# Patient Record
Sex: Female | Born: 1940 | Race: White | Hispanic: No | Smoking: Never smoker
Health system: Southern US, Community
[De-identification: ages and names within clinical notes are randomized; demographics above are authoritative.]

## PROBLEM LIST (undated history)

## (undated) DIAGNOSIS — M1990 Unspecified osteoarthritis, unspecified site: Secondary | ICD-10-CM

## (undated) DIAGNOSIS — C801 Malignant (primary) neoplasm, unspecified: Secondary | ICD-10-CM

## (undated) DIAGNOSIS — J189 Pneumonia, unspecified organism: Secondary | ICD-10-CM

## (undated) DIAGNOSIS — Z9289 Personal history of other medical treatment: Secondary | ICD-10-CM

## (undated) DIAGNOSIS — Z9221 Personal history of antineoplastic chemotherapy: Secondary | ICD-10-CM

## (undated) DIAGNOSIS — I10 Essential (primary) hypertension: Secondary | ICD-10-CM

## (undated) DIAGNOSIS — Z923 Personal history of irradiation: Secondary | ICD-10-CM

## (undated) DIAGNOSIS — E785 Hyperlipidemia, unspecified: Secondary | ICD-10-CM

## (undated) DIAGNOSIS — K5790 Diverticulosis of intestine, part unspecified, without perforation or abscess without bleeding: Secondary | ICD-10-CM

## (undated) DIAGNOSIS — K219 Gastro-esophageal reflux disease without esophagitis: Secondary | ICD-10-CM

## (undated) DIAGNOSIS — G4730 Sleep apnea, unspecified: Secondary | ICD-10-CM

## (undated) DIAGNOSIS — R350 Frequency of micturition: Secondary | ICD-10-CM

## (undated) DIAGNOSIS — Z973 Presence of spectacles and contact lenses: Secondary | ICD-10-CM

## (undated) DIAGNOSIS — E039 Hypothyroidism, unspecified: Secondary | ICD-10-CM

## (undated) DIAGNOSIS — Z8709 Personal history of other diseases of the respiratory system: Secondary | ICD-10-CM

## (undated) HISTORY — DX: Malignant (primary) neoplasm, unspecified: C80.1

## (undated) HISTORY — PX: CARPAL TUNNEL RELEASE: SHX101

## (undated) HISTORY — DX: Sleep apnea, unspecified: G47.30

## (undated) HISTORY — DX: Hyperlipidemia, unspecified: E78.5

## (undated) HISTORY — PX: ABDOMINAL HYSTERECTOMY: SHX81

## (undated) HISTORY — DX: Diverticulosis of intestine, part unspecified, without perforation or abscess without bleeding: K57.90

## (undated) HISTORY — PX: MASTECTOMY: SHX3

## (undated) HISTORY — PX: COLONOSCOPY: SHX174

## (undated) HISTORY — PX: TOTAL HIP ARTHROPLASTY: SHX124

## (undated) HISTORY — DX: Unspecified osteoarthritis, unspecified site: M19.90

## (undated) HISTORY — PX: ANTERIOR AND POSTERIOR VAGINAL REPAIR: SUR5

## (undated) HISTORY — DX: Hypothyroidism, unspecified: E03.9

---

## 1998-07-01 ENCOUNTER — Other Ambulatory Visit: Admission: RE | Admit: 1998-07-01 | Discharge: 1998-07-01 | Payer: Self-pay | Admitting: Obstetrics and Gynecology

## 1999-07-07 ENCOUNTER — Encounter (INDEPENDENT_AMBULATORY_CARE_PROVIDER_SITE_OTHER): Payer: Self-pay | Admitting: *Deleted

## 1999-07-22 ENCOUNTER — Other Ambulatory Visit: Admission: RE | Admit: 1999-07-22 | Discharge: 1999-07-22 | Payer: Self-pay | Admitting: Obstetrics and Gynecology

## 1999-12-05 ENCOUNTER — Encounter: Admission: RE | Admit: 1999-12-05 | Discharge: 1999-12-05 | Payer: Self-pay | Admitting: Family Medicine

## 2000-01-10 ENCOUNTER — Emergency Department (HOSPITAL_COMMUNITY): Admission: EM | Admit: 2000-01-10 | Discharge: 2000-01-10 | Payer: Self-pay | Admitting: Emergency Medicine

## 2000-05-26 ENCOUNTER — Encounter: Admission: RE | Admit: 2000-05-26 | Discharge: 2000-05-26 | Payer: Self-pay | Admitting: Family Medicine

## 2000-07-22 ENCOUNTER — Other Ambulatory Visit: Admission: RE | Admit: 2000-07-22 | Discharge: 2000-07-22 | Payer: Self-pay | Admitting: Obstetrics and Gynecology

## 2001-03-04 ENCOUNTER — Encounter: Admission: RE | Admit: 2001-03-04 | Discharge: 2001-03-04 | Payer: Self-pay | Admitting: Family Medicine

## 2001-03-10 ENCOUNTER — Encounter: Payer: Self-pay | Admitting: Family Medicine

## 2001-03-10 ENCOUNTER — Encounter: Admission: RE | Admit: 2001-03-10 | Discharge: 2001-03-10 | Payer: Self-pay | Admitting: Family Medicine

## 2001-03-15 ENCOUNTER — Encounter: Admission: RE | Admit: 2001-03-15 | Discharge: 2001-03-15 | Payer: Self-pay | Admitting: Sports Medicine

## 2001-03-15 ENCOUNTER — Encounter: Admission: RE | Admit: 2001-03-15 | Discharge: 2001-03-15 | Payer: Self-pay | Admitting: Family Medicine

## 2001-03-25 ENCOUNTER — Encounter: Admission: RE | Admit: 2001-03-25 | Discharge: 2001-03-25 | Payer: Self-pay | Admitting: Family Medicine

## 2001-03-29 ENCOUNTER — Encounter: Admission: RE | Admit: 2001-03-29 | Discharge: 2001-03-29 | Payer: Self-pay | Admitting: Sports Medicine

## 2001-05-06 ENCOUNTER — Encounter: Admission: RE | Admit: 2001-05-06 | Discharge: 2001-05-06 | Payer: Self-pay | Admitting: Family Medicine

## 2001-10-31 ENCOUNTER — Other Ambulatory Visit: Admission: RE | Admit: 2001-10-31 | Discharge: 2001-10-31 | Payer: Self-pay | Admitting: Obstetrics and Gynecology

## 2002-05-15 ENCOUNTER — Encounter: Admission: RE | Admit: 2002-05-15 | Discharge: 2002-05-15 | Payer: Self-pay | Admitting: Family Medicine

## 2002-10-06 ENCOUNTER — Encounter: Admission: RE | Admit: 2002-10-06 | Discharge: 2002-10-06 | Payer: Self-pay | Admitting: Family Medicine

## 2002-11-03 ENCOUNTER — Other Ambulatory Visit: Admission: RE | Admit: 2002-11-03 | Discharge: 2002-11-03 | Payer: Self-pay | Admitting: Obstetrics and Gynecology

## 2003-11-05 ENCOUNTER — Other Ambulatory Visit: Admission: RE | Admit: 2003-11-05 | Discharge: 2003-11-05 | Payer: Self-pay | Admitting: Obstetrics and Gynecology

## 2004-02-29 ENCOUNTER — Encounter: Admission: RE | Admit: 2004-02-29 | Discharge: 2004-02-29 | Payer: Self-pay | Admitting: Family Medicine

## 2004-03-17 ENCOUNTER — Ambulatory Visit: Payer: Self-pay | Admitting: Family Medicine

## 2004-03-24 ENCOUNTER — Encounter: Admission: RE | Admit: 2004-03-24 | Discharge: 2004-03-24 | Payer: Self-pay | Admitting: Sports Medicine

## 2004-04-04 ENCOUNTER — Ambulatory Visit: Payer: Self-pay | Admitting: Family Medicine

## 2004-04-23 ENCOUNTER — Encounter: Admission: RE | Admit: 2004-04-23 | Discharge: 2004-04-23 | Payer: Self-pay | Admitting: Otolaryngology

## 2004-05-14 ENCOUNTER — Ambulatory Visit: Payer: Self-pay | Admitting: Family Medicine

## 2004-07-04 ENCOUNTER — Ambulatory Visit: Payer: Self-pay | Admitting: Family Medicine

## 2004-07-14 ENCOUNTER — Ambulatory Visit: Payer: Self-pay | Admitting: Family Medicine

## 2004-09-12 ENCOUNTER — Ambulatory Visit (HOSPITAL_BASED_OUTPATIENT_CLINIC_OR_DEPARTMENT_OTHER): Admission: RE | Admit: 2004-09-12 | Discharge: 2004-09-12 | Payer: Self-pay | Admitting: Orthopedic Surgery

## 2004-09-12 ENCOUNTER — Ambulatory Visit (HOSPITAL_COMMUNITY): Admission: RE | Admit: 2004-09-12 | Discharge: 2004-09-12 | Payer: Self-pay | Admitting: Orthopedic Surgery

## 2004-10-03 ENCOUNTER — Ambulatory Visit: Payer: Self-pay | Admitting: Family Medicine

## 2004-11-07 ENCOUNTER — Ambulatory Visit: Payer: Self-pay | Admitting: Family Medicine

## 2004-12-10 ENCOUNTER — Ambulatory Visit: Payer: Self-pay | Admitting: Family Medicine

## 2004-12-10 ENCOUNTER — Encounter: Admission: RE | Admit: 2004-12-10 | Discharge: 2004-12-10 | Payer: Self-pay | Admitting: Family Medicine

## 2004-12-12 ENCOUNTER — Ambulatory Visit: Payer: Self-pay | Admitting: Family Medicine

## 2004-12-25 ENCOUNTER — Ambulatory Visit: Payer: Self-pay | Admitting: Family Medicine

## 2004-12-26 ENCOUNTER — Encounter: Admission: RE | Admit: 2004-12-26 | Discharge: 2004-12-26 | Payer: Self-pay | Admitting: Family Medicine

## 2005-03-11 ENCOUNTER — Encounter: Admission: RE | Admit: 2005-03-11 | Discharge: 2005-03-11 | Payer: Self-pay | Admitting: Family Medicine

## 2005-03-16 ENCOUNTER — Ambulatory Visit (HOSPITAL_COMMUNITY): Admission: RE | Admit: 2005-03-16 | Discharge: 2005-03-16 | Payer: Self-pay | Admitting: Gastroenterology

## 2005-04-20 ENCOUNTER — Ambulatory Visit: Payer: Self-pay | Admitting: Family Medicine

## 2005-10-12 ENCOUNTER — Ambulatory Visit: Payer: Self-pay | Admitting: Family Medicine

## 2005-12-14 ENCOUNTER — Other Ambulatory Visit: Admission: RE | Admit: 2005-12-14 | Discharge: 2005-12-14 | Payer: Self-pay | Admitting: Obstetrics & Gynecology

## 2006-02-12 ENCOUNTER — Ambulatory Visit: Payer: Self-pay | Admitting: Family Medicine

## 2006-03-12 ENCOUNTER — Encounter: Admission: RE | Admit: 2006-03-12 | Discharge: 2006-03-12 | Payer: Self-pay | Admitting: Sports Medicine

## 2006-03-12 ENCOUNTER — Ambulatory Visit: Payer: Self-pay | Admitting: Sports Medicine

## 2006-03-17 ENCOUNTER — Ambulatory Visit: Payer: Self-pay | Admitting: Sports Medicine

## 2006-03-31 ENCOUNTER — Ambulatory Visit: Payer: Self-pay | Admitting: Sports Medicine

## 2006-04-01 ENCOUNTER — Encounter: Admission: RE | Admit: 2006-04-01 | Discharge: 2006-04-01 | Payer: Self-pay | Admitting: Sports Medicine

## 2006-05-10 ENCOUNTER — Ambulatory Visit: Payer: Self-pay | Admitting: Sports Medicine

## 2006-06-04 ENCOUNTER — Emergency Department (HOSPITAL_COMMUNITY): Admission: EM | Admit: 2006-06-04 | Discharge: 2006-06-04 | Payer: Self-pay | Admitting: Emergency Medicine

## 2006-09-02 DIAGNOSIS — M1990 Unspecified osteoarthritis, unspecified site: Secondary | ICD-10-CM

## 2006-09-02 DIAGNOSIS — E7800 Pure hypercholesterolemia, unspecified: Secondary | ICD-10-CM

## 2006-09-02 DIAGNOSIS — E039 Hypothyroidism, unspecified: Secondary | ICD-10-CM

## 2006-09-02 DIAGNOSIS — G5600 Carpal tunnel syndrome, unspecified upper limb: Secondary | ICD-10-CM

## 2006-09-03 ENCOUNTER — Encounter (INDEPENDENT_AMBULATORY_CARE_PROVIDER_SITE_OTHER): Payer: Self-pay | Admitting: *Deleted

## 2006-10-29 ENCOUNTER — Ambulatory Visit: Payer: Self-pay | Admitting: Family Medicine

## 2006-10-29 ENCOUNTER — Encounter: Admission: RE | Admit: 2006-10-29 | Discharge: 2006-10-29 | Payer: Self-pay | Admitting: Family Medicine

## 2006-10-29 LAB — CONVERTED CEMR LAB
ALT: 32 units/L (ref 0–35)
CO2: 26 meq/L (ref 19–32)
Calcium: 9.3 mg/dL (ref 8.4–10.5)
Chloride: 106 meq/L (ref 96–112)
Creatinine, Ser: 0.68 mg/dL (ref 0.40–1.20)
Glucose, Bld: 96 mg/dL (ref 70–99)
Sodium: 141 meq/L (ref 135–145)
TSH: 0.666 microintl units/mL (ref 0.350–5.50)
Total Protein: 6.9 g/dL (ref 6.0–8.3)

## 2006-11-11 ENCOUNTER — Encounter: Admission: RE | Admit: 2006-11-11 | Discharge: 2006-12-02 | Payer: Self-pay | Admitting: Family Medicine

## 2007-07-07 DIAGNOSIS — C801 Malignant (primary) neoplasm, unspecified: Secondary | ICD-10-CM

## 2007-07-07 HISTORY — DX: Malignant (primary) neoplasm, unspecified: C80.1

## 2007-08-19 ENCOUNTER — Other Ambulatory Visit: Admission: RE | Admit: 2007-08-19 | Discharge: 2007-08-19 | Payer: Self-pay | Admitting: Obstetrics & Gynecology

## 2007-09-01 ENCOUNTER — Encounter: Payer: Self-pay | Admitting: Family Medicine

## 2007-09-08 ENCOUNTER — Encounter: Payer: Self-pay | Admitting: Family Medicine

## 2007-09-08 DIAGNOSIS — C50919 Malignant neoplasm of unspecified site of unspecified female breast: Secondary | ICD-10-CM | POA: Insufficient documentation

## 2007-09-09 ENCOUNTER — Ambulatory Visit: Payer: Self-pay | Admitting: Oncology

## 2007-09-21 ENCOUNTER — Encounter: Payer: Self-pay | Admitting: Family Medicine

## 2007-09-21 LAB — CBC WITH DIFFERENTIAL/PLATELET
BASO%: 0.1 % (ref 0.0–2.0)
Eosinophils Absolute: 0.2 10*3/uL (ref 0.0–0.5)
LYMPH%: 8 % — ABNORMAL LOW (ref 14.0–48.0)
MCHC: 34.9 g/dL (ref 32.0–36.0)
MONO#: 0.6 10*3/uL (ref 0.1–0.9)
MONO%: 6.1 % (ref 0.0–13.0)
NEUT#: 8.9 10*3/uL — ABNORMAL HIGH (ref 1.5–6.5)
RBC: 4.22 10*6/uL (ref 3.70–5.32)
RDW: 14.2 % (ref 11.3–14.5)
WBC: 10.6 10*3/uL — ABNORMAL HIGH (ref 3.9–10.0)

## 2007-09-21 LAB — COMPREHENSIVE METABOLIC PANEL
ALT: 24 U/L (ref 0–35)
Albumin: 4.2 g/dL (ref 3.5–5.2)
Alkaline Phosphatase: 100 U/L (ref 39–117)
CO2: 24 mEq/L (ref 19–32)
Glucose, Bld: 92 mg/dL (ref 70–99)
Potassium: 4.4 mEq/L (ref 3.5–5.3)
Sodium: 140 mEq/L (ref 135–145)
Total Bilirubin: 0.5 mg/dL (ref 0.3–1.2)
Total Protein: 7.1 g/dL (ref 6.0–8.3)

## 2007-09-21 LAB — CANCER ANTIGEN 27.29: CA 27.29: 40 U/mL — ABNORMAL HIGH (ref 0–39)

## 2007-09-22 LAB — VITAMIN D 25 HYDROXY (VIT D DEFICIENCY, FRACTURES): Vit D, 25-Hydroxy: 31 ng/mL (ref 30–89)

## 2007-09-23 ENCOUNTER — Ambulatory Visit (HOSPITAL_COMMUNITY): Admission: RE | Admit: 2007-09-23 | Discharge: 2007-09-23 | Payer: Self-pay | Admitting: Oncology

## 2007-09-23 ENCOUNTER — Encounter: Payer: Self-pay | Admitting: Critical Care Medicine

## 2007-09-26 LAB — VITAMIN D 1,25 DIHYDROXY: Vit D, 1,25-Dihydroxy: 39 pg/mL (ref 6–62)

## 2007-09-28 ENCOUNTER — Encounter: Payer: Self-pay | Admitting: Oncology

## 2007-09-28 ENCOUNTER — Ambulatory Visit: Payer: Self-pay

## 2007-09-29 ENCOUNTER — Ambulatory Visit (HOSPITAL_COMMUNITY): Admission: RE | Admit: 2007-09-29 | Discharge: 2007-09-29 | Payer: Self-pay | Admitting: Oncology

## 2007-10-04 ENCOUNTER — Encounter: Admission: RE | Admit: 2007-10-04 | Discharge: 2007-10-04 | Payer: Self-pay | Admitting: Oncology

## 2007-10-10 ENCOUNTER — Encounter (INDEPENDENT_AMBULATORY_CARE_PROVIDER_SITE_OTHER): Payer: Self-pay | Admitting: Surgery

## 2007-10-10 ENCOUNTER — Ambulatory Visit (HOSPITAL_COMMUNITY): Admission: RE | Admit: 2007-10-10 | Discharge: 2007-10-11 | Payer: Self-pay | Admitting: Surgery

## 2007-10-17 ENCOUNTER — Ambulatory Visit: Payer: Self-pay | Admitting: Critical Care Medicine

## 2007-10-17 DIAGNOSIS — J8410 Pulmonary fibrosis, unspecified: Secondary | ICD-10-CM

## 2007-10-18 ENCOUNTER — Ambulatory Visit: Payer: Self-pay | Admitting: Critical Care Medicine

## 2007-10-18 ENCOUNTER — Ambulatory Visit: Admission: RE | Admit: 2007-10-18 | Discharge: 2007-10-18 | Payer: Self-pay | Admitting: Critical Care Medicine

## 2007-10-18 ENCOUNTER — Encounter: Payer: Self-pay | Admitting: Critical Care Medicine

## 2007-10-19 ENCOUNTER — Ambulatory Visit: Payer: Self-pay | Admitting: Oncology

## 2007-10-19 ENCOUNTER — Encounter: Payer: Self-pay | Admitting: Family Medicine

## 2007-10-21 ENCOUNTER — Telehealth (INDEPENDENT_AMBULATORY_CARE_PROVIDER_SITE_OTHER): Payer: Self-pay | Admitting: *Deleted

## 2007-10-21 LAB — CBC WITH DIFFERENTIAL/PLATELET
BASO%: 0.4 % (ref 0.0–2.0)
Basophils Absolute: 0 10*3/uL (ref 0.0–0.1)
EOS%: 2 % (ref 0.0–7.0)
Eosinophils Absolute: 0.2 10*3/uL (ref 0.0–0.5)
HCT: 35.1 % (ref 34.8–46.6)
HGB: 12.1 g/dL (ref 11.6–15.9)
LYMPH%: 7.7 % — ABNORMAL LOW (ref 14.0–48.0)
MCH: 30.2 pg (ref 26.0–34.0)
MCHC: 34.5 g/dL (ref 32.0–36.0)
MCV: 87.5 fL (ref 81.0–101.0)
MONO#: 0.6 10*3/uL (ref 0.1–0.9)
MONO%: 6.4 % (ref 0.0–13.0)
NEUT#: 7.9 10*3/uL — ABNORMAL HIGH (ref 1.5–6.5)
NEUT%: 83.5 % — ABNORMAL HIGH (ref 39.6–76.8)
Platelets: 306 10*3/uL (ref 145–400)
RBC: 4.01 10*6/uL (ref 3.70–5.32)
RDW: 13.9 % (ref 11.3–14.5)
WBC: 9.5 10*3/uL (ref 3.9–10.0)
lymph#: 0.7 10*3/uL — ABNORMAL LOW (ref 0.9–3.3)

## 2007-10-24 ENCOUNTER — Telehealth: Payer: Self-pay | Admitting: Critical Care Medicine

## 2007-10-24 ENCOUNTER — Encounter: Payer: Self-pay | Admitting: Critical Care Medicine

## 2007-10-24 LAB — COMPREHENSIVE METABOLIC PANEL
Albumin: 3.7 g/dL (ref 3.5–5.2)
BUN: 18 mg/dL (ref 6–23)
CO2: 25 mEq/L (ref 19–32)
Calcium: 8.9 mg/dL (ref 8.4–10.5)
Chloride: 103 mEq/L (ref 96–112)
Glucose, Bld: 144 mg/dL — ABNORMAL HIGH (ref 70–99)
Potassium: 4.6 mEq/L (ref 3.5–5.3)
Sodium: 140 mEq/L (ref 135–145)
Total Protein: 6.9 g/dL (ref 6.0–8.3)

## 2007-10-24 LAB — CANCER ANTIGEN 27.29: CA 27.29: 48 U/mL — ABNORMAL HIGH (ref 0–39)

## 2007-10-24 LAB — VITAMIN D 25 HYDROXY (VIT D DEFICIENCY, FRACTURES): Vit D, 25-Hydroxy: 34 ng/mL (ref 30–89)

## 2007-10-24 LAB — LACTATE DEHYDROGENASE: LDH: 213 U/L (ref 94–250)

## 2007-11-07 LAB — CBC WITH DIFFERENTIAL/PLATELET
BASO%: 0.5 % (ref 0.0–2.0)
Basophils Absolute: 0.2 10*3/uL — ABNORMAL HIGH (ref 0.0–0.1)
EOS%: 0.3 % (ref 0.0–7.0)
HCT: 37.9 % (ref 34.8–46.6)
LYMPH%: 5.8 % — ABNORMAL LOW (ref 14.0–48.0)
MCH: 29.1 pg (ref 26.0–34.0)
MCHC: 32.5 g/dL (ref 32.0–36.0)
MONO#: 0.5 10*3/uL (ref 0.1–0.9)
NEUT%: 92 % — ABNORMAL HIGH (ref 39.6–76.8)
Platelets: 263 10*3/uL (ref 145–400)

## 2007-11-08 ENCOUNTER — Ambulatory Visit: Payer: Self-pay | Admitting: Critical Care Medicine

## 2007-11-08 ENCOUNTER — Encounter: Payer: Self-pay | Admitting: Critical Care Medicine

## 2007-11-14 LAB — CBC WITH DIFFERENTIAL/PLATELET
Basophils Absolute: 0.2 10*3/uL — ABNORMAL HIGH (ref 0.0–0.1)
EOS%: 0.5 % (ref 0.0–7.0)
Eosinophils Absolute: 0 10*3/uL (ref 0.0–0.5)
HGB: 12.2 g/dL (ref 11.6–15.9)
MCH: 29.7 pg (ref 26.0–34.0)
NEUT#: 7 10*3/uL — ABNORMAL HIGH (ref 1.5–6.5)
RBC: 4.12 10*6/uL (ref 3.70–5.32)
RDW: 13.8 % (ref 11.3–14.5)
lymph#: 1.3 10*3/uL (ref 0.9–3.3)

## 2007-12-01 ENCOUNTER — Ambulatory Visit: Payer: Self-pay | Admitting: Oncology

## 2007-12-05 LAB — CBC WITH DIFFERENTIAL/PLATELET
Eosinophils Absolute: 0 10*3/uL (ref 0.0–0.5)
HCT: 34.1 % — ABNORMAL LOW (ref 34.8–46.6)
HGB: 11.7 g/dL (ref 11.6–15.9)
LYMPH%: 15.4 % (ref 14.0–48.0)
MCH: 30.7 pg (ref 26.0–34.0)
MONO#: 0.7 10*3/uL (ref 0.1–0.9)
MONO%: 7.4 % (ref 0.0–13.0)
RDW: 16 % — ABNORMAL HIGH (ref 11.3–14.5)
WBC: 9 10*3/uL (ref 3.9–10.0)
lymph#: 1.4 10*3/uL (ref 0.9–3.3)

## 2007-12-12 LAB — CBC WITH DIFFERENTIAL/PLATELET
BASO%: 0.4 % (ref 0.0–2.0)
EOS%: 0.1 % (ref 0.0–7.0)
LYMPH%: 6.6 % — ABNORMAL LOW (ref 14.0–48.0)
MCHC: 34.2 g/dL (ref 32.0–36.0)
MONO#: 0.3 10*3/uL (ref 0.1–0.9)
MONO%: 5 % (ref 0.0–13.0)
Platelets: 391 10*3/uL (ref 145–400)
RBC: 3.44 10*6/uL — ABNORMAL LOW (ref 3.70–5.32)
WBC: 6.2 10*3/uL (ref 3.9–10.0)

## 2007-12-12 LAB — COMPREHENSIVE METABOLIC PANEL
ALT: 36 U/L — ABNORMAL HIGH (ref 0–35)
AST: 20 U/L (ref 0–37)
Alkaline Phosphatase: 96 U/L (ref 39–117)
CO2: 23 mEq/L (ref 19–32)
Sodium: 140 mEq/L (ref 135–145)
Total Bilirubin: 0.5 mg/dL (ref 0.3–1.2)
Total Protein: 6.9 g/dL (ref 6.0–8.3)

## 2007-12-19 LAB — CBC WITH DIFFERENTIAL/PLATELET
BASO%: 1.3 % (ref 0.0–2.0)
LYMPH%: 4.8 % — ABNORMAL LOW (ref 14.0–48.0)
MCH: 31 pg (ref 26.0–34.0)
MCHC: 33.7 g/dL (ref 32.0–36.0)
MCV: 91.9 fL (ref 81.0–101.0)
MONO%: 6.1 % (ref 0.0–13.0)
NEUT%: 87.6 % — ABNORMAL HIGH (ref 39.6–76.8)
Platelets: 259 10*3/uL (ref 145–400)
RBC: 3.5 10*6/uL — ABNORMAL LOW (ref 3.70–5.32)

## 2007-12-26 LAB — COMPREHENSIVE METABOLIC PANEL
ALT: 49 U/L — ABNORMAL HIGH (ref 0–35)
AST: 23 U/L (ref 0–37)
Calcium: 8.2 mg/dL — ABNORMAL LOW (ref 8.4–10.5)
Chloride: 104 mEq/L (ref 96–112)
Creatinine, Ser: 0.56 mg/dL (ref 0.40–1.20)
Total Bilirubin: 0.4 mg/dL (ref 0.3–1.2)

## 2007-12-26 LAB — CBC WITH DIFFERENTIAL/PLATELET
BASO%: 0.7 % (ref 0.0–2.0)
EOS%: 0.1 % (ref 0.0–7.0)
LYMPH%: 9 % — ABNORMAL LOW (ref 14.0–48.0)
MCH: 31.1 pg (ref 26.0–34.0)
MCHC: 33.7 g/dL (ref 32.0–36.0)
MONO#: 0.6 10*3/uL (ref 0.1–0.9)
Platelets: 178 10*3/uL (ref 145–400)
RBC: 3.59 10*6/uL — ABNORMAL LOW (ref 3.70–5.32)
WBC: 10.4 10*3/uL — ABNORMAL HIGH (ref 3.9–10.0)
lymph#: 0.9 10*3/uL (ref 0.9–3.3)

## 2008-01-02 LAB — COMPREHENSIVE METABOLIC PANEL
ALT: 33 U/L (ref 0–35)
AST: 17 U/L (ref 0–37)
Creatinine, Ser: 0.59 mg/dL (ref 0.40–1.20)
Total Bilirubin: 0.3 mg/dL (ref 0.3–1.2)

## 2008-01-02 LAB — CBC WITH DIFFERENTIAL/PLATELET
BASO%: 0.5 % (ref 0.0–2.0)
EOS%: 0 % (ref 0.0–7.0)
HCT: 32.3 % — ABNORMAL LOW (ref 34.8–46.6)
LYMPH%: 8.1 % — ABNORMAL LOW (ref 14.0–48.0)
MCH: 32.3 pg (ref 26.0–34.0)
MCHC: 34.7 g/dL (ref 32.0–36.0)
MONO%: 9 % (ref 0.0–13.0)
NEUT%: 82.5 % — ABNORMAL HIGH (ref 39.6–76.8)
Platelets: 439 10*3/uL — ABNORMAL HIGH (ref 145–400)

## 2008-01-05 ENCOUNTER — Encounter: Payer: Self-pay | Admitting: Family Medicine

## 2008-01-10 LAB — CBC WITH DIFFERENTIAL/PLATELET
EOS%: 0 % (ref 0.0–7.0)
Eosinophils Absolute: 0 10*3/uL (ref 0.0–0.5)
LYMPH%: 3.8 % — ABNORMAL LOW (ref 14.0–48.0)
MCH: 31.5 pg (ref 26.0–34.0)
MCHC: 33.3 g/dL (ref 32.0–36.0)
MCV: 94.8 fL (ref 81.0–101.0)
MONO%: 5.1 % (ref 0.0–13.0)
NEUT#: 35.7 10*3/uL — ABNORMAL HIGH (ref 1.5–6.5)
Platelets: 327 10*3/uL (ref 145–400)
RBC: 3.39 10*6/uL — ABNORMAL LOW (ref 3.70–5.32)
RDW: 18.6 % — ABNORMAL HIGH (ref 11.3–14.5)

## 2008-01-11 ENCOUNTER — Ambulatory Visit: Payer: Self-pay | Admitting: Oncology

## 2008-01-16 LAB — CBC WITH DIFFERENTIAL/PLATELET
BASO%: 1.8 % (ref 0.0–2.0)
Eosinophils Absolute: 0 10*3/uL (ref 0.0–0.5)
LYMPH%: 11.2 % — ABNORMAL LOW (ref 14.0–48.0)
MCHC: 34 g/dL (ref 32.0–36.0)
MONO#: 0.7 10*3/uL (ref 0.1–0.9)
MONO%: 8.4 % (ref 0.0–13.0)
NEUT#: 6.4 10*3/uL (ref 1.5–6.5)
Platelets: 163 10*3/uL (ref 145–400)
RBC: 3.37 10*6/uL — ABNORMAL LOW (ref 3.70–5.32)
RDW: 18.1 % — ABNORMAL HIGH (ref 11.3–14.5)
WBC: 8.2 10*3/uL (ref 3.9–10.0)

## 2008-01-23 LAB — COMPREHENSIVE METABOLIC PANEL
ALT: 39 U/L — ABNORMAL HIGH (ref 0–35)
Albumin: 4.4 g/dL (ref 3.5–5.2)
CO2: 23 mEq/L (ref 19–32)
Calcium: 9.2 mg/dL (ref 8.4–10.5)
Chloride: 104 mEq/L (ref 96–112)
Glucose, Bld: 141 mg/dL — ABNORMAL HIGH (ref 70–99)
Sodium: 137 mEq/L (ref 135–145)
Total Protein: 6.7 g/dL (ref 6.0–8.3)

## 2008-01-23 LAB — CBC WITH DIFFERENTIAL/PLATELET
BASO%: 0.3 % (ref 0.0–2.0)
Eosinophils Absolute: 0 10*3/uL (ref 0.0–0.5)
HCT: 32.3 % — ABNORMAL LOW (ref 34.8–46.6)
MCHC: 33.3 g/dL (ref 32.0–36.0)
MONO#: 0.3 10*3/uL (ref 0.1–0.9)
NEUT#: 4.2 10*3/uL (ref 1.5–6.5)
RBC: 3.38 10*6/uL — ABNORMAL LOW (ref 3.70–5.32)
WBC: 5 10*3/uL (ref 3.9–10.0)
lymph#: 0.4 10*3/uL — ABNORMAL LOW (ref 0.9–3.3)

## 2008-01-27 ENCOUNTER — Encounter: Admission: RE | Admit: 2008-01-27 | Discharge: 2008-02-17 | Payer: Self-pay | Admitting: Surgery

## 2008-01-30 LAB — CBC WITH DIFFERENTIAL/PLATELET
BASO%: 1.1 % (ref 0.0–2.0)
EOS%: 0.2 % (ref 0.0–7.0)
MCH: 32.7 pg (ref 26.0–34.0)
MCHC: 34 g/dL (ref 32.0–36.0)
MONO#: 2.2 10*3/uL — ABNORMAL HIGH (ref 0.1–0.9)
RBC: 3.44 10*6/uL — ABNORMAL LOW (ref 3.70–5.32)
RDW: 17.3 % — ABNORMAL HIGH (ref 11.3–14.5)
WBC: 23.8 10*3/uL — ABNORMAL HIGH (ref 3.9–10.0)
lymph#: 1.6 10*3/uL (ref 0.9–3.3)

## 2008-02-06 LAB — CBC WITH DIFFERENTIAL/PLATELET
Eosinophils Absolute: 0 10*3/uL (ref 0.0–0.5)
HCT: 31.4 % — ABNORMAL LOW (ref 34.8–46.6)
LYMPH%: 12.6 % — ABNORMAL LOW (ref 14.0–48.0)
MONO#: 0.5 10*3/uL (ref 0.1–0.9)
NEUT#: 5.2 10*3/uL (ref 1.5–6.5)
NEUT%: 79.1 % — ABNORMAL HIGH (ref 39.6–76.8)
Platelets: 158 10*3/uL (ref 145–400)
WBC: 6.6 10*3/uL (ref 3.9–10.0)

## 2008-02-13 LAB — COMPREHENSIVE METABOLIC PANEL
ALT: 47 U/L — ABNORMAL HIGH (ref 0–35)
BUN: 16 mg/dL (ref 6–23)
CO2: 24 mEq/L (ref 19–32)
Creatinine, Ser: 0.7 mg/dL (ref 0.40–1.20)
Total Bilirubin: 0.4 mg/dL (ref 0.3–1.2)

## 2008-02-13 LAB — CBC WITH DIFFERENTIAL/PLATELET
BASO%: 0.7 % (ref 0.0–2.0)
Basophils Absolute: 0 10*3/uL (ref 0.0–0.1)
HCT: 30.8 % — ABNORMAL LOW (ref 34.8–46.6)
LYMPH%: 7.7 % — ABNORMAL LOW (ref 14.0–48.0)
MCH: 32.5 pg (ref 26.0–34.0)
MCHC: 33.3 g/dL (ref 32.0–36.0)
MONO#: 0.7 10*3/uL (ref 0.1–0.9)
NEUT%: 80.9 % — ABNORMAL HIGH (ref 39.6–76.8)
Platelets: 340 10*3/uL (ref 145–400)
WBC: 6.8 10*3/uL (ref 3.9–10.0)

## 2008-02-17 ENCOUNTER — Ambulatory Visit: Payer: Self-pay | Admitting: Oncology

## 2008-02-20 LAB — CBC WITH DIFFERENTIAL/PLATELET
Basophils Absolute: 0.3 10*3/uL — ABNORMAL HIGH (ref 0.0–0.1)
Eosinophils Absolute: 0.1 10*3/uL (ref 0.0–0.5)
HCT: 31.3 % — ABNORMAL LOW (ref 34.8–46.6)
HGB: 10.7 g/dL — ABNORMAL LOW (ref 11.6–15.9)
LYMPH%: 9.5 % — ABNORMAL LOW (ref 14.0–48.0)
MONO#: 2.2 10*3/uL — ABNORMAL HIGH (ref 0.1–0.9)
NEUT#: 13.4 10*3/uL — ABNORMAL HIGH (ref 1.5–6.5)
NEUT%: 75.8 % (ref 39.6–76.8)
Platelets: 293 10*3/uL (ref 145–400)
WBC: 17.6 10*3/uL — ABNORMAL HIGH (ref 3.9–10.0)
lymph#: 1.7 10*3/uL (ref 0.9–3.3)

## 2008-02-21 ENCOUNTER — Encounter: Payer: Self-pay | Admitting: Family Medicine

## 2008-02-24 ENCOUNTER — Ambulatory Visit: Admission: RE | Admit: 2008-02-24 | Discharge: 2008-05-07 | Payer: Self-pay | Admitting: Radiation Oncology

## 2008-02-27 ENCOUNTER — Encounter: Payer: Self-pay | Admitting: Family Medicine

## 2008-02-27 LAB — CBC WITH DIFFERENTIAL/PLATELET
BASO%: 1 % (ref 0.0–2.0)
Basophils Absolute: 0.1 10*3/uL (ref 0.0–0.1)
EOS%: 0.7 % (ref 0.0–7.0)
HCT: 30.5 % — ABNORMAL LOW (ref 34.8–46.6)
HGB: 10.2 g/dL — ABNORMAL LOW (ref 11.6–15.9)
LYMPH%: 13.5 % — ABNORMAL LOW (ref 14.0–48.0)
MCH: 33.6 pg (ref 26.0–34.0)
MCHC: 33.4 g/dL (ref 32.0–36.0)
NEUT%: 75.3 % (ref 39.6–76.8)
Platelets: 161 10*3/uL (ref 145–400)

## 2008-02-29 ENCOUNTER — Ambulatory Visit: Payer: Self-pay

## 2008-02-29 ENCOUNTER — Encounter: Payer: Self-pay | Admitting: Oncology

## 2008-03-05 LAB — CBC WITH DIFFERENTIAL/PLATELET
Basophils Absolute: 0.1 10*3/uL (ref 0.0–0.1)
EOS%: 1.1 % (ref 0.0–7.0)
Eosinophils Absolute: 0.1 10*3/uL (ref 0.0–0.5)
HGB: 9.8 g/dL — ABNORMAL LOW (ref 11.6–15.9)
LYMPH%: 16.6 % (ref 14.0–48.0)
MCH: 33.2 pg (ref 26.0–34.0)
MCV: 102 fL — ABNORMAL HIGH (ref 81.0–101.0)
MONO%: 10.2 % (ref 0.0–13.0)
NEUT#: 3.9 10*3/uL (ref 1.5–6.5)
NEUT%: 70.8 % (ref 39.6–76.8)
Platelets: 265 10*3/uL (ref 145–400)
RDW: 17 % — ABNORMAL HIGH (ref 11.3–14.5)

## 2008-03-09 ENCOUNTER — Ambulatory Visit: Payer: Self-pay | Admitting: Critical Care Medicine

## 2008-03-13 LAB — CBC WITH DIFFERENTIAL/PLATELET
BASO%: 1.2 % (ref 0.0–2.0)
Basophils Absolute: 0.1 10*3/uL (ref 0.0–0.1)
Eosinophils Absolute: 0.2 10*3/uL (ref 0.0–0.5)
HCT: 31.1 % — ABNORMAL LOW (ref 34.8–46.6)
HGB: 10.2 g/dL — ABNORMAL LOW (ref 11.6–15.9)
LYMPH%: 11.8 % — ABNORMAL LOW (ref 14.0–48.0)
MCHC: 32.7 g/dL (ref 32.0–36.0)
MONO#: 0.6 10*3/uL (ref 0.1–0.9)
NEUT#: 5 10*3/uL (ref 1.5–6.5)
NEUT%: 75.1 % (ref 39.6–76.8)
Platelets: 411 10*3/uL — ABNORMAL HIGH (ref 145–400)
WBC: 6.7 10*3/uL (ref 3.9–10.0)
lymph#: 0.8 10*3/uL — ABNORMAL LOW (ref 0.9–3.3)

## 2008-03-19 LAB — CBC WITH DIFFERENTIAL/PLATELET
BASO%: 1.5 % (ref 0.0–2.0)
Basophils Absolute: 0.1 10*3/uL (ref 0.0–0.1)
EOS%: 3.3 % (ref 0.0–7.0)
HCT: 32.4 % — ABNORMAL LOW (ref 34.8–46.6)
HGB: 10.7 g/dL — ABNORMAL LOW (ref 11.6–15.9)
LYMPH%: 11.4 % — ABNORMAL LOW (ref 14.0–48.0)
MCH: 33 pg (ref 26.0–34.0)
MCHC: 33 g/dL (ref 32.0–36.0)
MCV: 99.9 fL (ref 81.0–101.0)
NEUT%: 75.3 % (ref 39.6–76.8)
Platelets: 282 10*3/uL (ref 145–400)
lymph#: 0.9 10*3/uL (ref 0.9–3.3)

## 2008-03-26 LAB — CBC WITH DIFFERENTIAL/PLATELET
BASO%: 1.8 % (ref 0.0–2.0)
Basophils Absolute: 0.1 10*3/uL (ref 0.0–0.1)
EOS%: 7.9 % — ABNORMAL HIGH (ref 0.0–7.0)
HCT: 34.4 % — ABNORMAL LOW (ref 34.8–46.6)
HGB: 11.4 g/dL — ABNORMAL LOW (ref 11.6–15.9)
MCH: 32.9 pg (ref 26.0–34.0)
MCHC: 33.3 g/dL (ref 32.0–36.0)
MCV: 99 fL (ref 81.0–101.0)
MONO%: 8.5 % (ref 0.0–13.0)
NEUT%: 68.6 % (ref 39.6–76.8)
RDW: 13.7 % (ref 11.3–14.5)

## 2008-04-02 LAB — CBC WITH DIFFERENTIAL/PLATELET
Eosinophils Absolute: 0.4 10*3/uL (ref 0.0–0.5)
MONO#: 0.4 10*3/uL (ref 0.1–0.9)
NEUT#: 4.6 10*3/uL (ref 1.5–6.5)
RBC: 3.53 10*6/uL — ABNORMAL LOW (ref 3.70–5.32)
RDW: 13.3 % (ref 11.3–14.5)
WBC: 6.3 10*3/uL (ref 3.9–10.0)

## 2008-04-05 ENCOUNTER — Ambulatory Visit: Payer: Self-pay | Admitting: Oncology

## 2008-04-09 LAB — CBC WITH DIFFERENTIAL/PLATELET
BASO%: 1.5 % (ref 0.0–2.0)
MCHC: 32.9 g/dL (ref 32.0–36.0)
MONO#: 0.4 10*3/uL (ref 0.1–0.9)
RBC: 3.5 10*6/uL — ABNORMAL LOW (ref 3.70–5.32)
RDW: 12.8 % (ref 11.3–14.5)
WBC: 6 10*3/uL (ref 3.9–10.0)
lymph#: 0.6 10*3/uL — ABNORMAL LOW (ref 0.9–3.3)

## 2008-04-11 ENCOUNTER — Encounter: Admission: RE | Admit: 2008-04-11 | Discharge: 2008-06-04 | Payer: Self-pay | Admitting: Oncology

## 2008-04-16 LAB — CBC WITH DIFFERENTIAL/PLATELET
BASO%: 1.3 % (ref 0.0–2.0)
Basophils Absolute: 0.1 10*3/uL (ref 0.0–0.1)
HCT: 36.1 % (ref 34.8–46.6)
HGB: 11.8 g/dL (ref 11.6–15.9)
MONO#: 0.6 10*3/uL (ref 0.1–0.9)
NEUT%: 72.2 % (ref 39.6–76.8)
WBC: 5.9 10*3/uL (ref 3.9–10.0)
lymph#: 0.7 10*3/uL — ABNORMAL LOW (ref 0.9–3.3)

## 2008-04-16 LAB — COMPREHENSIVE METABOLIC PANEL
ALT: 32 U/L (ref 0–35)
Albumin: 4.3 g/dL (ref 3.5–5.2)
CO2: 24 mEq/L (ref 19–32)
Calcium: 9.3 mg/dL (ref 8.4–10.5)
Chloride: 101 mEq/L (ref 96–112)
Creatinine, Ser: 0.77 mg/dL (ref 0.40–1.20)

## 2008-04-23 LAB — CBC WITH DIFFERENTIAL/PLATELET
BASO%: 2.6 % — ABNORMAL HIGH (ref 0.0–2.0)
Eosinophils Absolute: 0.3 10*3/uL (ref 0.0–0.5)
MCHC: 34.2 g/dL (ref 32.0–36.0)
MCV: 94.4 fL (ref 81.0–101.0)
MONO#: 0.4 10*3/uL (ref 0.1–0.9)
MONO%: 7.2 % (ref 0.0–13.0)
NEUT#: 3.6 10*3/uL (ref 1.5–6.5)
RBC: 3.81 10*6/uL (ref 3.70–5.32)
RDW: 12.6 % (ref 11.3–14.5)
WBC: 5.2 10*3/uL (ref 3.9–10.0)

## 2008-04-30 LAB — CBC WITH DIFFERENTIAL/PLATELET
BASO%: 1.9 % (ref 0.0–2.0)
Eosinophils Absolute: 0.2 10*3/uL (ref 0.0–0.5)
LYMPH%: 13.9 % — ABNORMAL LOW (ref 14.0–48.0)
MCHC: 33.5 g/dL (ref 32.0–36.0)
MONO#: 0.3 10*3/uL (ref 0.1–0.9)
NEUT#: 3.4 10*3/uL (ref 1.5–6.5)
Platelets: 298 10*3/uL (ref 145–400)
RBC: 3.96 10*6/uL (ref 3.70–5.32)
WBC: 4.7 10*3/uL (ref 3.9–10.0)
lymph#: 0.6 10*3/uL — ABNORMAL LOW (ref 0.9–3.3)

## 2008-05-02 ENCOUNTER — Encounter: Payer: Self-pay | Admitting: Family Medicine

## 2008-05-07 LAB — COMPREHENSIVE METABOLIC PANEL
ALT: 25 U/L (ref 0–35)
BUN: 14 mg/dL (ref 6–23)
CO2: 24 mEq/L (ref 19–32)
Calcium: 8.9 mg/dL (ref 8.4–10.5)
Chloride: 105 mEq/L (ref 96–112)
Creatinine, Ser: 0.67 mg/dL (ref 0.40–1.20)
Glucose, Bld: 167 mg/dL — ABNORMAL HIGH (ref 70–99)

## 2008-05-07 LAB — CBC WITH DIFFERENTIAL/PLATELET
Basophils Absolute: 0.1 10*3/uL (ref 0.0–0.1)
Eosinophils Absolute: 0.2 10*3/uL (ref 0.0–0.5)
HCT: 37.2 % (ref 34.8–46.6)
HGB: 12.3 g/dL (ref 11.6–15.9)
MCV: 94.2 fL (ref 81.0–101.0)
NEUT#: 4.1 10*3/uL (ref 1.5–6.5)
NEUT%: 75.7 % (ref 39.6–76.8)
RDW: 12.6 % (ref 11.3–14.5)
lymph#: 0.5 10*3/uL — ABNORMAL LOW (ref 0.9–3.3)

## 2008-05-21 ENCOUNTER — Encounter: Payer: Self-pay | Admitting: Family Medicine

## 2008-05-22 ENCOUNTER — Encounter: Payer: Self-pay | Admitting: Oncology

## 2008-05-22 ENCOUNTER — Ambulatory Visit: Payer: Self-pay | Admitting: Cardiology

## 2008-05-22 ENCOUNTER — Ambulatory Visit: Admission: RE | Admit: 2008-05-22 | Discharge: 2008-05-22 | Payer: Self-pay | Admitting: Oncology

## 2008-05-23 ENCOUNTER — Encounter: Payer: Self-pay | Admitting: Family Medicine

## 2008-05-24 ENCOUNTER — Ambulatory Visit: Payer: Self-pay | Admitting: Oncology

## 2008-05-28 LAB — CBC WITH DIFFERENTIAL/PLATELET
BASO%: 1.2 % (ref 0.0–2.0)
HCT: 36.5 % (ref 34.8–46.6)
MCHC: 33.8 g/dL (ref 32.0–36.0)
MONO#: 0.5 10*3/uL (ref 0.1–0.9)
NEUT#: 4.5 10*3/uL (ref 1.5–6.5)
NEUT%: 74.4 % (ref 39.6–76.8)
WBC: 6.1 10*3/uL (ref 3.9–10.0)
lymph#: 0.7 10*3/uL — ABNORMAL LOW (ref 0.9–3.3)

## 2008-05-28 LAB — COMPREHENSIVE METABOLIC PANEL
ALT: 28 U/L (ref 0–35)
CO2: 26 mEq/L (ref 19–32)
Calcium: 9.2 mg/dL (ref 8.4–10.5)
Chloride: 104 mEq/L (ref 96–112)
Creatinine, Ser: 0.69 mg/dL (ref 0.40–1.20)
Sodium: 140 mEq/L (ref 135–145)
Total Protein: 6.9 g/dL (ref 6.0–8.3)

## 2008-06-18 LAB — CBC WITH DIFFERENTIAL/PLATELET
Basophils Absolute: 0.1 10*3/uL (ref 0.0–0.1)
EOS%: 3.4 % (ref 0.0–7.0)
Eosinophils Absolute: 0.2 10*3/uL (ref 0.0–0.5)
HCT: 35 % (ref 34.8–46.6)
HGB: 11.9 g/dL (ref 11.6–15.9)
LYMPH%: 13.2 % — ABNORMAL LOW (ref 14.0–48.0)
MCH: 30.4 pg (ref 26.0–34.0)
MCV: 89.3 fL (ref 81.0–101.0)
MONO%: 10.7 % (ref 0.0–13.0)
NEUT#: 4.5 10*3/uL (ref 1.5–6.5)
NEUT%: 71.3 % (ref 39.6–76.8)
Platelets: 302 10*3/uL (ref 145–400)
RDW: 13 % (ref 11.3–14.5)

## 2008-06-18 LAB — COMPREHENSIVE METABOLIC PANEL
AST: 19 U/L (ref 0–37)
Albumin: 4.2 g/dL (ref 3.5–5.2)
Alkaline Phosphatase: 89 U/L (ref 39–117)
BUN: 15 mg/dL (ref 6–23)
Calcium: 9 mg/dL (ref 8.4–10.5)
Creatinine, Ser: 0.62 mg/dL (ref 0.40–1.20)
Glucose, Bld: 103 mg/dL — ABNORMAL HIGH (ref 70–99)
Potassium: 4 mEq/L (ref 3.5–5.3)

## 2008-07-05 ENCOUNTER — Ambulatory Visit: Payer: Self-pay | Admitting: Oncology

## 2008-07-10 LAB — COMPREHENSIVE METABOLIC PANEL
AST: 19 U/L (ref 0–37)
Albumin: 4.1 g/dL (ref 3.5–5.2)
BUN: 15 mg/dL (ref 6–23)
Calcium: 9 mg/dL (ref 8.4–10.5)
Chloride: 104 mEq/L (ref 96–112)
Creatinine, Ser: 0.64 mg/dL (ref 0.40–1.20)
Glucose, Bld: 89 mg/dL (ref 70–99)

## 2008-07-10 LAB — CBC WITH DIFFERENTIAL/PLATELET
Basophils Absolute: 0 10*3/uL (ref 0.0–0.1)
EOS%: 2 % (ref 0.0–7.0)
Eosinophils Absolute: 0.1 10*3/uL (ref 0.0–0.5)
HCT: 34.5 % — ABNORMAL LOW (ref 34.8–46.6)
HGB: 11.8 g/dL (ref 11.6–15.9)
MCH: 30.7 pg (ref 26.0–34.0)
MONO#: 0.3 10*3/uL (ref 0.1–0.9)
NEUT#: 4.3 10*3/uL (ref 1.5–6.5)
NEUT%: 75.9 % (ref 39.6–76.8)
RDW: 14 % (ref 11.3–14.5)
lymph#: 0.9 10*3/uL (ref 0.9–3.3)

## 2008-07-30 ENCOUNTER — Encounter: Payer: Self-pay | Admitting: Critical Care Medicine

## 2008-08-15 ENCOUNTER — Encounter: Payer: Self-pay | Admitting: Oncology

## 2008-08-15 ENCOUNTER — Ambulatory Visit: Payer: Self-pay | Admitting: Cardiology

## 2008-08-15 ENCOUNTER — Ambulatory Visit: Admission: RE | Admit: 2008-08-15 | Discharge: 2008-08-15 | Payer: Self-pay | Admitting: Oncology

## 2008-08-16 ENCOUNTER — Ambulatory Visit: Payer: Self-pay | Admitting: Oncology

## 2008-08-20 ENCOUNTER — Encounter: Payer: Self-pay | Admitting: Critical Care Medicine

## 2008-08-20 LAB — CBC WITH DIFFERENTIAL/PLATELET
BASO%: 0.1 % (ref 0.0–2.0)
Eosinophils Absolute: 0.1 10*3/uL (ref 0.0–0.5)
MONO#: 0.5 10*3/uL (ref 0.1–0.9)
MONO%: 7.5 % (ref 0.0–13.0)
NEUT#: 5 10*3/uL (ref 1.5–6.5)
RBC: 3.93 10*6/uL (ref 3.70–5.32)
RDW: 14 % (ref 11.3–14.5)
WBC: 6.3 10*3/uL (ref 3.9–10.0)

## 2008-08-31 ENCOUNTER — Ambulatory Visit: Payer: Self-pay | Admitting: Family Medicine

## 2008-08-31 LAB — CONVERTED CEMR LAB
AST: 16 units/L (ref 0–37)
Alkaline Phosphatase: 84 units/L (ref 39–117)
BUN: 20 mg/dL (ref 6–23)
Creatinine, Ser: 0.98 mg/dL (ref 0.40–1.20)
HDL: 66 mg/dL (ref >39)
LDL Cholesterol: 67 mg/dL (ref 0–99)
Total Bilirubin: 0.3 mg/dL (ref 0.3–1.2)
Total CHOL/HDL Ratio: 2.3
VLDL: 16 mg/dL (ref 0–40)

## 2008-09-03 ENCOUNTER — Encounter: Payer: Self-pay | Admitting: Family Medicine

## 2008-09-03 LAB — CONVERTED CEMR LAB: TSH: 0.776 microintl units/mL (ref 0.350–4.50)

## 2008-09-08 IMAGING — CR DG CHEST 2V
2 series · 2 of 2 positions shown · non-contrast
Comparison: 10/10/2007

CLINICAL DATA: Pulmonary fibrosis

CHEST - 2 VIEW

[view not recorded (1 of 2)]
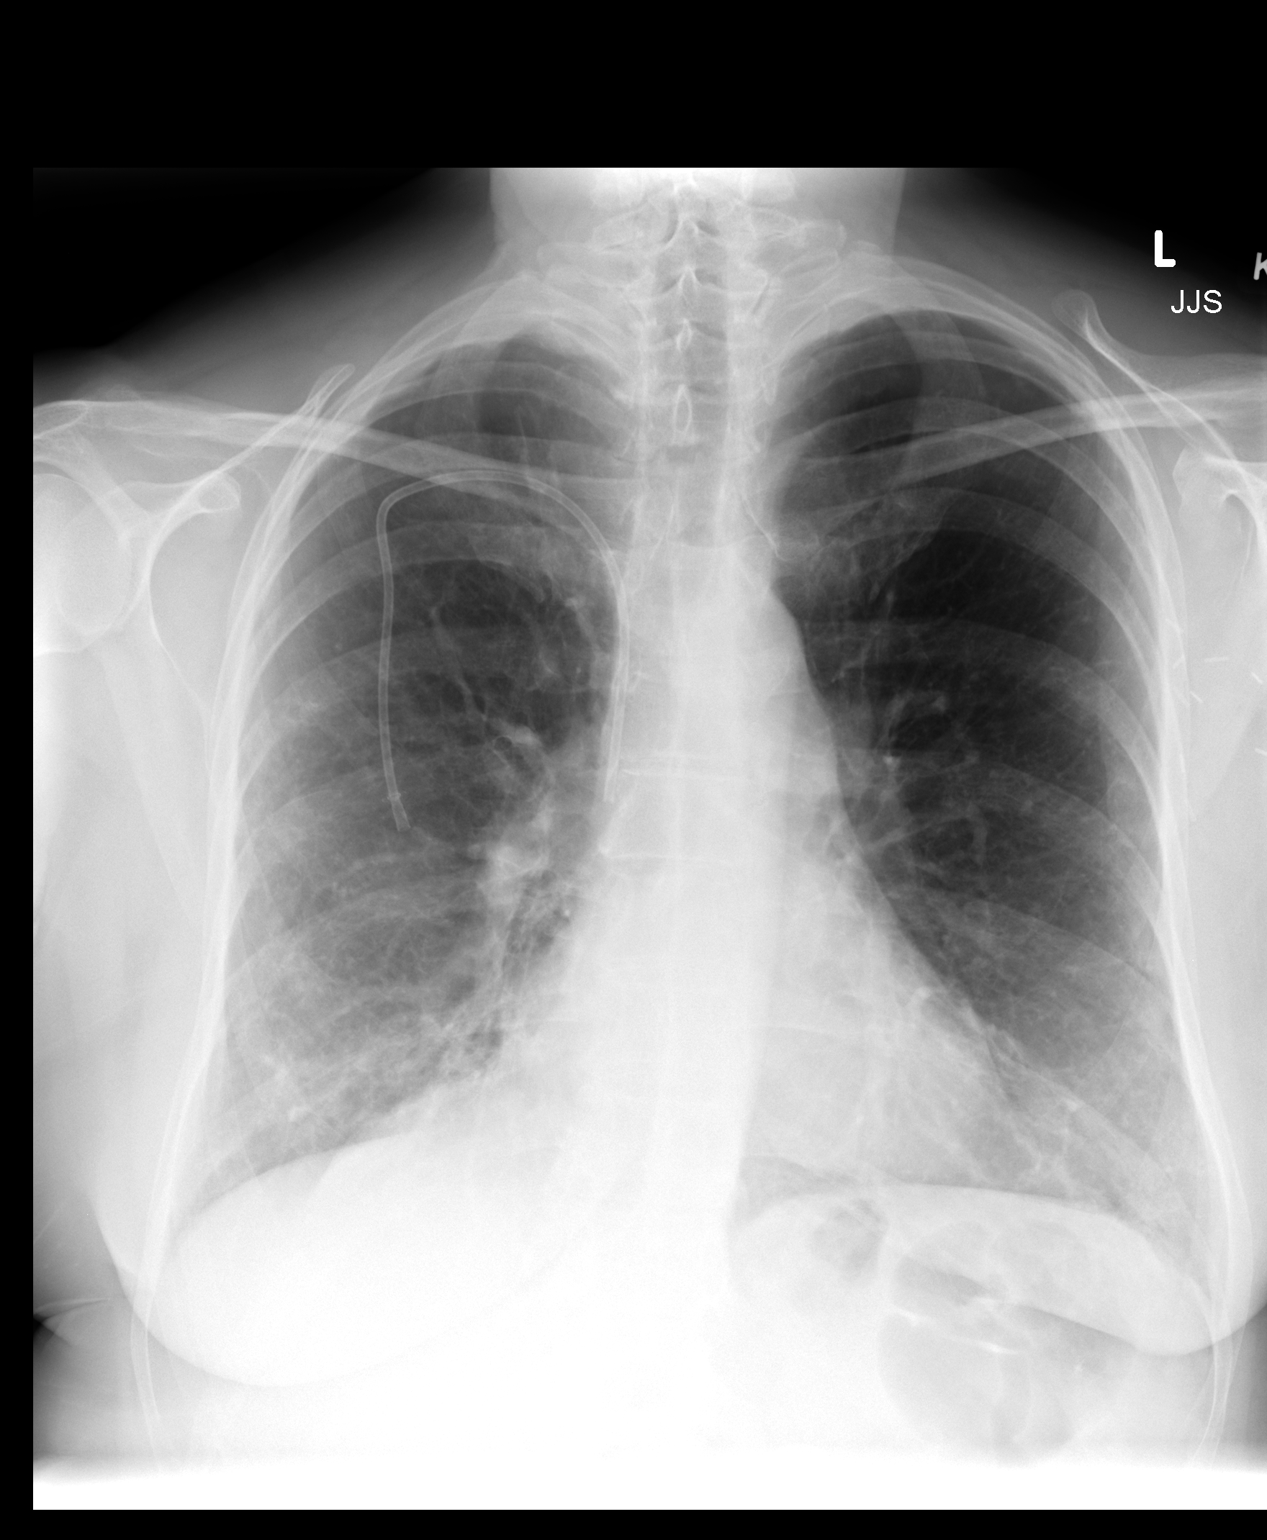

[view not recorded (2 of 2)]
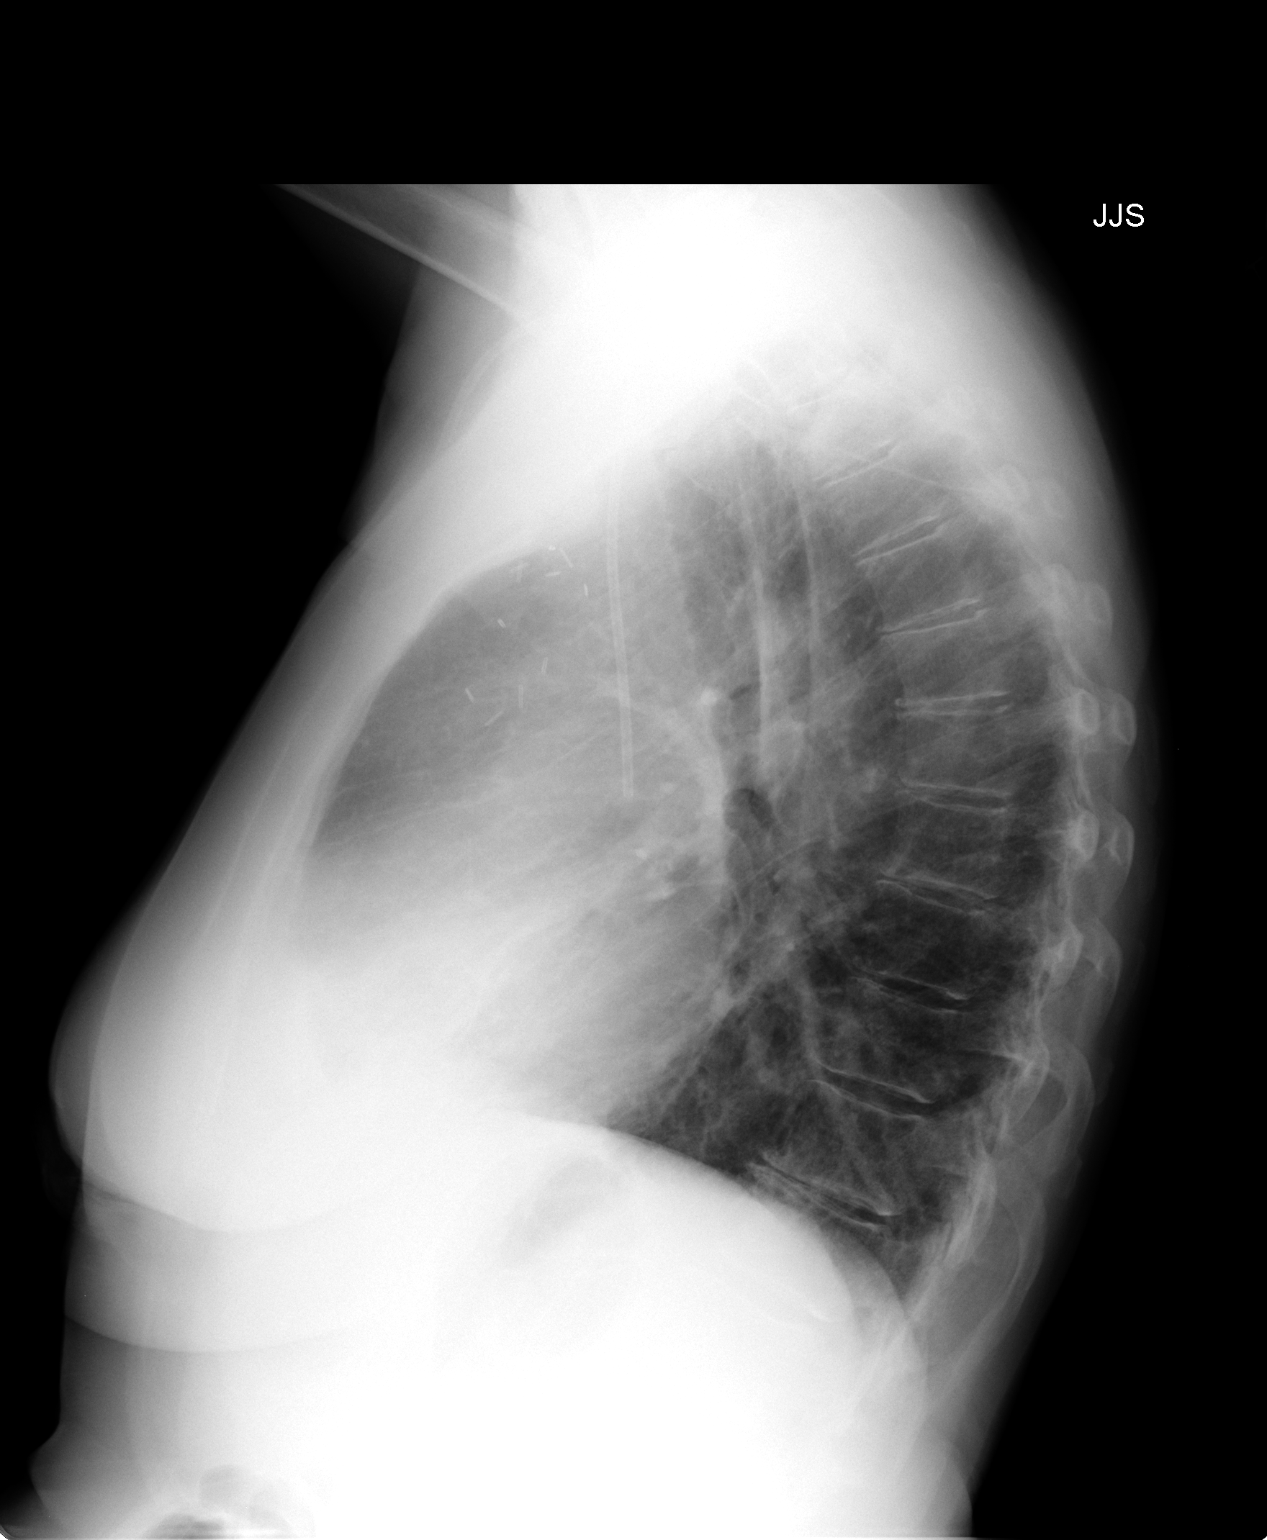

[2 of 2 positions shown; findings below may reference images not displayed]

FINDINGS: The Port-A-Cath is stable.  The cardiac silhouette,
mediastinal and hilar contours are unchanged.  There are chronic
lung changes with COPD and lower lobe pulmonary fibrosis.  No acute
overlying pulmonary process.  No pleural effusions.  There are
surgical changes noted the left axilla.
IMPRESSION: 1.  Stable porta cath
2.  Chronic lung changes without definite acute overlying pulmonary
process.

## 2008-09-10 LAB — CBC WITH DIFFERENTIAL/PLATELET
BASO%: 0.6 % (ref 0.0–2.0)
HCT: 37.8 % (ref 34.8–46.6)
LYMPH%: 11.4 % — ABNORMAL LOW (ref 14.0–49.7)
MCH: 29.8 pg (ref 25.1–34.0)
MCHC: 33.1 g/dL (ref 31.5–36.0)
MCV: 90.2 fL (ref 79.5–101.0)
MONO#: 0.6 10*3/uL (ref 0.1–0.9)
NEUT%: 78 % — ABNORMAL HIGH (ref 38.4–76.8)
Platelets: 231 10*3/uL (ref 145–400)
WBC: 7.1 10*3/uL (ref 3.9–10.3)

## 2008-09-10 LAB — COMPREHENSIVE METABOLIC PANEL
AST: 18 U/L (ref 0–37)
BUN: 13 mg/dL (ref 6–23)
Calcium: 9.1 mg/dL (ref 8.4–10.5)
Chloride: 103 mEq/L (ref 96–112)
Creatinine, Ser: 0.62 mg/dL (ref 0.40–1.20)

## 2008-09-18 ENCOUNTER — Telehealth (INDEPENDENT_AMBULATORY_CARE_PROVIDER_SITE_OTHER): Payer: Self-pay | Admitting: *Deleted

## 2008-09-24 ENCOUNTER — Other Ambulatory Visit: Admission: RE | Admit: 2008-09-24 | Discharge: 2008-09-24 | Payer: Self-pay | Admitting: Obstetrics & Gynecology

## 2008-09-27 ENCOUNTER — Ambulatory Visit: Payer: Self-pay | Admitting: Oncology

## 2008-10-01 LAB — CBC WITH DIFFERENTIAL/PLATELET
Basophils Absolute: 0 10*3/uL (ref 0.0–0.1)
Eosinophils Absolute: 0.1 10*3/uL (ref 0.0–0.5)
HCT: 38.2 % (ref 34.8–46.6)
HGB: 12.6 g/dL (ref 11.6–15.9)
MCH: 29.9 pg (ref 25.1–34.0)
MONO#: 0.5 10*3/uL (ref 0.1–0.9)
NEUT%: 72.3 % (ref 38.4–76.8)
WBC: 5.9 10*3/uL (ref 3.9–10.3)
lymph#: 1 10*3/uL (ref 0.9–3.3)

## 2008-10-01 LAB — COMPREHENSIVE METABOLIC PANEL
Albumin: 4.2 g/dL (ref 3.5–5.2)
BUN: 20 mg/dL (ref 6–23)
CO2: 28 mEq/L (ref 19–32)
Calcium: 9.3 mg/dL (ref 8.4–10.5)
Chloride: 102 mEq/L (ref 96–112)
Glucose, Bld: 101 mg/dL — ABNORMAL HIGH (ref 70–99)
Potassium: 4.3 mEq/L (ref 3.5–5.3)

## 2008-10-23 LAB — CBC WITH DIFFERENTIAL/PLATELET
BASO%: 0.8 % (ref 0.0–2.0)
Basophils Absolute: 0 10*3/uL (ref 0.0–0.1)
EOS%: 2.4 % (ref 0.0–7.0)
Eosinophils Absolute: 0.1 10*3/uL (ref 0.0–0.5)
HCT: 37.1 % (ref 34.8–46.6)
HGB: 12.3 g/dL (ref 11.6–15.9)
LYMPH%: 17.3 % (ref 14.0–49.7)
MCH: 30.2 pg (ref 25.1–34.0)
MCHC: 33.2 g/dL (ref 31.5–36.0)
MCV: 91.2 fL (ref 79.5–101.0)
MONO#: 0.5 10*3/uL (ref 0.1–0.9)
MONO%: 9.6 % (ref 0.0–14.0)
NEUT#: 3.6 10*3/uL (ref 1.5–6.5)
NEUT%: 69.9 % (ref 38.4–76.8)
Platelets: 225 10*3/uL (ref 145–400)
RBC: 4.07 10*6/uL (ref 3.70–5.45)
RDW: 13.7 % (ref 11.2–14.5)
WBC: 5.1 10*3/uL (ref 3.9–10.3)
lymph#: 0.9 10*3/uL (ref 0.9–3.3)

## 2008-10-23 LAB — COMPREHENSIVE METABOLIC PANEL
ALT: 21 U/L (ref 0–35)
AST: 23 U/L (ref 0–37)
Albumin: 4.3 g/dL (ref 3.5–5.2)
Alkaline Phosphatase: 90 U/L (ref 39–117)
BUN: 16 mg/dL (ref 6–23)
CO2: 25 mEq/L (ref 19–32)
Calcium: 9.2 mg/dL (ref 8.4–10.5)
Chloride: 104 mEq/L (ref 96–112)
Creatinine, Ser: 0.65 mg/dL (ref 0.40–1.20)
Glucose, Bld: 94 mg/dL (ref 70–99)
Potassium: 4.2 mEq/L (ref 3.5–5.3)
Sodium: 140 mEq/L (ref 135–145)
Total Bilirubin: 0.4 mg/dL (ref 0.3–1.2)
Total Protein: 7 g/dL (ref 6.0–8.3)

## 2008-11-06 ENCOUNTER — Inpatient Hospital Stay (HOSPITAL_COMMUNITY): Admission: RE | Admit: 2008-11-06 | Discharge: 2008-11-09 | Payer: Self-pay | Admitting: Orthopedic Surgery

## 2008-11-30 ENCOUNTER — Encounter: Payer: Self-pay | Admitting: Family Medicine

## 2009-01-10 ENCOUNTER — Ambulatory Visit (HOSPITAL_BASED_OUTPATIENT_CLINIC_OR_DEPARTMENT_OTHER): Admission: RE | Admit: 2009-01-10 | Discharge: 2009-01-10 | Payer: Self-pay | Admitting: Surgery

## 2009-01-12 ENCOUNTER — Emergency Department (HOSPITAL_COMMUNITY): Admission: EM | Admit: 2009-01-12 | Discharge: 2009-01-12 | Payer: Self-pay | Admitting: Family Medicine

## 2009-02-08 ENCOUNTER — Telehealth: Payer: Self-pay | Admitting: Family Medicine

## 2009-02-10 ENCOUNTER — Encounter: Payer: Self-pay | Admitting: Family Medicine

## 2009-02-10 ENCOUNTER — Emergency Department (HOSPITAL_COMMUNITY): Admission: EM | Admit: 2009-02-10 | Discharge: 2009-02-10 | Payer: Self-pay | Admitting: Family Medicine

## 2009-02-28 ENCOUNTER — Ambulatory Visit: Payer: Self-pay | Admitting: Oncology

## 2009-03-04 ENCOUNTER — Encounter: Payer: Self-pay | Admitting: Family Medicine

## 2009-03-04 LAB — CBC WITH DIFFERENTIAL/PLATELET
BASO%: 0.5 % (ref 0.0–2.0)
EOS%: 2 % (ref 0.0–7.0)
HCT: 39.8 % (ref 34.8–46.6)
LYMPH%: 16.8 % (ref 14.0–49.7)
MCH: 30 pg (ref 25.1–34.0)
MCHC: 32.7 g/dL (ref 31.5–36.0)
MONO#: 0.4 10*3/uL (ref 0.1–0.9)
NEUT%: 74.1 % (ref 38.4–76.8)
Platelets: 214 10*3/uL (ref 145–400)

## 2009-03-05 LAB — COMPREHENSIVE METABOLIC PANEL
ALT: 17 U/L (ref 0–35)
AST: 14 U/L (ref 0–37)
Albumin: 4.1 g/dL (ref 3.5–5.2)
Alkaline Phosphatase: 84 U/L (ref 39–117)
BUN: 13 mg/dL (ref 6–23)
CO2: 24 mEq/L (ref 19–32)
Calcium: 9.4 mg/dL (ref 8.4–10.5)
Chloride: 105 mEq/L (ref 96–112)
Creatinine, Ser: 0.6 mg/dL (ref 0.40–1.20)
Glucose, Bld: 139 mg/dL — ABNORMAL HIGH (ref 70–99)
Potassium: 4.1 mEq/L (ref 3.5–5.3)
Sodium: 141 mEq/L (ref 135–145)
Total Bilirubin: 0.5 mg/dL (ref 0.3–1.2)
Total Protein: 6.4 g/dL (ref 6.0–8.3)

## 2009-04-29 ENCOUNTER — Encounter: Admission: RE | Admit: 2009-04-29 | Discharge: 2009-05-16 | Payer: Self-pay | Admitting: Surgery

## 2009-05-02 ENCOUNTER — Ambulatory Visit: Payer: Self-pay | Admitting: Family Medicine

## 2009-09-04 ENCOUNTER — Encounter: Payer: Self-pay | Admitting: Family Medicine

## 2009-09-18 ENCOUNTER — Ambulatory Visit: Payer: Self-pay | Admitting: Family Medicine

## 2009-09-18 DIAGNOSIS — J159 Unspecified bacterial pneumonia: Secondary | ICD-10-CM | POA: Insufficient documentation

## 2009-10-18 ENCOUNTER — Ambulatory Visit: Payer: Self-pay | Admitting: Family Medicine

## 2009-10-18 ENCOUNTER — Encounter: Admission: RE | Admit: 2009-10-18 | Discharge: 2009-10-18 | Payer: Self-pay | Admitting: Family Medicine

## 2009-10-18 ENCOUNTER — Ambulatory Visit (HOSPITAL_COMMUNITY): Admission: RE | Admit: 2009-10-18 | Discharge: 2009-10-18 | Payer: Self-pay | Admitting: Family Medicine

## 2009-11-04 ENCOUNTER — Ambulatory Visit: Payer: Self-pay | Admitting: Oncology

## 2009-11-05 LAB — COMPREHENSIVE METABOLIC PANEL
Alkaline Phosphatase: 74 U/L (ref 39–117)
Creatinine, Ser: 0.59 mg/dL (ref 0.40–1.20)
Glucose, Bld: 94 mg/dL (ref 70–99)
Sodium: 137 mEq/L (ref 135–145)
Total Bilirubin: 0.5 mg/dL (ref 0.3–1.2)
Total Protein: 6.9 g/dL (ref 6.0–8.3)

## 2009-11-05 LAB — CBC WITH DIFFERENTIAL/PLATELET
Basophils Absolute: 0 10*3/uL (ref 0.0–0.1)
EOS%: 2.6 % (ref 0.0–7.0)
LYMPH%: 25.9 % (ref 14.0–49.7)
MCH: 31 pg (ref 25.1–34.0)
MCV: 92.7 fL (ref 79.5–101.0)
MONO%: 9.3 % (ref 0.0–14.0)
Platelets: 248 10*3/uL (ref 145–400)
RBC: 4.13 10*6/uL (ref 3.70–5.45)
RDW: 14.8 % — ABNORMAL HIGH (ref 11.2–14.5)
nRBC: 0 % (ref 0–0)

## 2009-11-05 LAB — LACTATE DEHYDROGENASE: LDH: 146 U/L (ref 94–250)

## 2009-11-05 LAB — CANCER ANTIGEN 27.29: CA 27.29: 37 U/mL (ref 0–39)

## 2009-11-20 ENCOUNTER — Encounter: Admission: RE | Admit: 2009-11-20 | Discharge: 2009-11-20 | Payer: Self-pay | Admitting: Oncology

## 2009-11-29 ENCOUNTER — Encounter: Payer: Self-pay | Admitting: Family Medicine

## 2009-12-10 ENCOUNTER — Encounter: Payer: Self-pay | Admitting: Obstetrics & Gynecology

## 2009-12-10 ENCOUNTER — Ambulatory Visit (HOSPITAL_COMMUNITY): Admission: RE | Admit: 2009-12-10 | Discharge: 2009-12-11 | Payer: Self-pay | Admitting: Obstetrics & Gynecology

## 2009-12-23 ENCOUNTER — Encounter: Payer: Self-pay | Admitting: Family Medicine

## 2010-03-06 ENCOUNTER — Encounter: Payer: Self-pay | Admitting: Family Medicine

## 2010-04-28 ENCOUNTER — Encounter: Payer: Self-pay | Admitting: Family Medicine

## 2010-05-09 ENCOUNTER — Ambulatory Visit: Payer: Self-pay | Admitting: Oncology

## 2010-05-09 ENCOUNTER — Ambulatory Visit: Payer: Self-pay | Admitting: Family Medicine

## 2010-05-16 LAB — CBC WITH DIFFERENTIAL/PLATELET
Basophils Absolute: 0 10*3/uL (ref 0.0–0.1)
Eosinophils Absolute: 0.1 10*3/uL (ref 0.0–0.5)
HGB: 13.2 g/dL (ref 11.6–15.9)
MCV: 93.6 fL (ref 79.5–101.0)
MONO#: 0.6 10*3/uL (ref 0.1–0.9)
MONO%: 9 % (ref 0.0–14.0)
NEUT#: 4.2 10*3/uL (ref 1.5–6.5)
RBC: 4.2 10*6/uL (ref 3.70–5.45)
RDW: 13.6 % (ref 11.2–14.5)
WBC: 6.2 10*3/uL (ref 3.9–10.3)
lymph#: 1.3 10*3/uL (ref 0.9–3.3)

## 2010-05-17 LAB — COMPREHENSIVE METABOLIC PANEL
Albumin: 4.2 g/dL (ref 3.5–5.2)
BUN: 13 mg/dL (ref 6–23)
CO2: 26 mEq/L (ref 19–32)
Calcium: 9.3 mg/dL (ref 8.4–10.5)
Chloride: 104 mEq/L (ref 96–112)
Glucose, Bld: 105 mg/dL — ABNORMAL HIGH (ref 70–99)
Potassium: 3.9 mEq/L (ref 3.5–5.3)
Sodium: 140 mEq/L (ref 135–145)
Total Protein: 6.8 g/dL (ref 6.0–8.3)

## 2010-05-17 LAB — CANCER ANTIGEN 27.29: CA 27.29: 23 U/mL (ref 0–39)

## 2010-05-17 LAB — LACTATE DEHYDROGENASE: LDH: 179 U/L (ref 94–250)

## 2010-05-17 LAB — VITAMIN D 25 HYDROXY (VIT D DEFICIENCY, FRACTURES): Vit D, 25-Hydroxy: 34 ng/mL (ref 30–89)

## 2010-06-02 ENCOUNTER — Encounter: Payer: Self-pay | Admitting: Family Medicine

## 2010-06-18 ENCOUNTER — Encounter: Payer: Self-pay | Admitting: Family Medicine

## 2010-06-18 ENCOUNTER — Ambulatory Visit: Payer: Self-pay | Admitting: Family Medicine

## 2010-06-18 DIAGNOSIS — R109 Unspecified abdominal pain: Secondary | ICD-10-CM | POA: Insufficient documentation

## 2010-06-18 LAB — CONVERTED CEMR LAB
AST: 19 units/L (ref 0–37)
Alkaline Phosphatase: 100 units/L (ref 39–117)
BUN: 17 mg/dL (ref 6–23)
Creatinine, Ser: 0.59 mg/dL (ref 0.40–1.20)
HCT: 39.3 % (ref 36.0–46.0)
Hemoglobin: 13.1 g/dL (ref 12.0–15.0)
MCHC: 33.3 g/dL (ref 30.0–36.0)
Potassium: 4 meq/L (ref 3.5–5.3)
RDW: 13.5 % (ref 11.5–15.5)
Total Bilirubin: 0.3 mg/dL (ref 0.3–1.2)

## 2010-06-23 ENCOUNTER — Encounter
Admission: RE | Admit: 2010-06-23 | Discharge: 2010-06-23 | Payer: Self-pay | Source: Home / Self Care | Attending: Family Medicine | Admitting: Family Medicine

## 2010-07-04 ENCOUNTER — Ambulatory Visit: Payer: Self-pay | Admitting: Family Medicine

## 2010-07-27 ENCOUNTER — Encounter: Payer: Self-pay | Admitting: Oncology

## 2010-08-03 LAB — CONVERTED CEMR LAB
Alkaline Phosphatase: 82 units/L (ref 39–117)
Basophils Absolute: 0 10*3/uL (ref 0.0–0.1)
Basophils Relative: 1 % (ref 0–1)
Glucose, Bld: 94 mg/dL (ref 70–99)
HDL: 58 mg/dL (ref >39)
Hemoglobin: 12.9 g/dL (ref 12.0–15.0)
LDL Cholesterol: 70 mg/dL (ref 0–99)
MCHC: 31.9 g/dL (ref 30.0–36.0)
Monocytes Absolute: 0.5 10*3/uL (ref 0.1–1.0)
Neutro Abs: 4.6 10*3/uL (ref 1.7–7.7)
Neutrophils Relative %: 73 % (ref 43–77)
RDW: 14.8 % (ref 11.5–15.5)
Sodium: 139 meq/L (ref 135–145)
Total Bilirubin: 0.5 mg/dL (ref 0.3–1.2)
Total CHOL/HDL Ratio: 2.4
Total Protein: 6.8 g/dL (ref 6.0–8.3)
Triglycerides: 65 mg/dL (ref <150)
VLDL: 13 mg/dL (ref 0–40)

## 2010-08-05 NOTE — Miscellaneous (Signed)
Summary: refill zolpidem  Medications Added AMBIEN 10 MG  TABS (ZOLPIDEM TARTRATE) 1/2 by mouth at bedtime and a second  1/2 tab after flushing tube feed 4 hours later.       Clinical Lists Changes  Medications: Changed medication from AMBIEN 10 MG  TABS (ZOLPIDEM TARTRATE) 1/2 by mouth at bedtime to AMBIEN 10 MG  TABS (ZOLPIDEM TARTRATE) 1/2 by mouth at bedtime and a second  1/2 tab after flushing tube feed 4 hours later. - Signed Rx of AMBIEN 10 MG  TABS (ZOLPIDEM TARTRATE) 1/2 by mouth at bedtime and a second  1/2 tab after flushing tube feed 4 hours later.;  #30 x 5;  Signed;  Entered by: Doralee Albino MD;  Authorized by: Doralee Albino MD;  Method used: Printed then faxed to [facility]*, [address] [place], [place]  [id number], Ph: [phone] or [phone], Fax: [phone]    Prescriptions: AMBIEN 10 MG  TABS (ZOLPIDEM TARTRATE) 1/2 by mouth at bedtime and a second  1/2 tab after flushing tube feed 4 hours later.  #30 x 5   Entered and Authorized by:   Doralee Albino MD   Signed by:   Doralee Albino MD on 04/28/2010   Method used:   Printed then faxed to ...       [facility]* (retail)       [address]       [place], [place]  [id number]       Ph: [phone] or [phone]       Fax: [phone]   RxID:   [id number]

## 2010-08-05 NOTE — Assessment & Plan Note (Signed)
Summary: PRE OP PHYSICAL/W/ALL LAB WORK INCLUDED & EKG/[facility]   Vital Signs:  Patient profile:   70 year old female Height:      63.25 inches Weight:      149.2 pounds BMI:     26.32 Temp:     98.7 degrees F oral Pulse rate:   80 / minute BP sitting:   144 / 77  (left arm) Cuff size:   regular  Vitals Entered By: Gladstone Pih (October 18, 2009 9:58 AM) CC: needs pre-op PE clearence for surg Is Patient Diabetic? No Pain Assessment Patient in pain? no        Primary Care Provider:  Dr. Doralee Albino  CC:  needs pre-op PE clearence for surg.  History of Present Illness: Preop for Vag hys and BSO plus bladder suspension.  JUne 7. Needs preop PE with report to go to Dr. [name], [facility], [address], [place], [address].   No sig cardiac hx Does have remote hx of pulm fibrosis, has been seen by pulm Delford Field) and is asymptomatic.  Has not used albuterol for months.   Habits & Providers  Alcohol-Tobacco-Diet     Tobacco Status: never  Current Medications (verified): 1)  Lipitor 20 Mg Tabs (Atorvastatin Calcium) .... Take 1 Tablet By Mouth Once A Day 2)  Synthroid 100 Mcg Tabs (Levothyroxine Sodium) .[name].. 1 Tablet By Mouth Once A Day 3)  Omeprazole 20 Mg  Tbec (Omeprazole) .[name].. 1 By Mouth Daily 4)  Alprazolam 0.25 Mg  Tabs (Alprazolam) .... As Needed 5)  Ambien 10 Mg  Tabs (Zolpidem Tartrate) .... 1/2 By Mouth At Bedtime 6)  Aromasin 25 Mg Tabs (Exemestane) .[name].. 1 Tab By Mouth Daily For Breast Cancer 7)  Ventolin Hfa 108 (90 Base) Mcg/act Aers (Albuterol Sulfate) .... 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) s/p L breast lumpectomy 4/09 FOOT PAIN, RIGHT (ICD-729.5) OSTEOARTHRITIS, MULTI  SITES (ICD-715.98) HYPERCHOLESTEROLEMIA (ICD-272.0) GOITER NOS (ICD-240.9) multinodular CARPAL TUNNEL SYNDROME (ICD-354.0) BOOP 2009    -TLC 93%  DLCO 67% 2009  Past Surgical History: Last updated: 11/26/2008 BTL 1973 - 12/05/1999,  T-score -0.36 - 03/17/2001 L lumpectomy 4/09 Hand surgery in past  prolapsed bladder repair 06 Left total hip replacement 5/10  Family History: Last updated: 10/29/2006 - Ca, CVA, ETOHism, depression, + CAD, DM,  Social History: Last updated: 08/31/2008 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    Physical Exam  General:  Well-developed,well-nourished,in no acute distress; alert,appropriate and cooperative throughout examination Head:  Normocephalic and atraumatic without obvious abnormalities. No apparent alopecia or balding. Eyes:  No corneal or conjunctival inflammation noted. EOMI. Perrla. Funduscopic exam benign, without hemorrhages, exudates or papilledema. Vision grossly normal. Mouth:  Oral mucosa and oropharynx without lesions or exudates.  Teeth in good repair. Neck:  No deformities, masses, or tenderness noted. Lungs:  Some crackles right chest high anterior, otherwise normal Heart:  Normal rate and regular rhythm. S1 and S2  normal without gallop, murmur, click, rub or other extra sounds. Abdomen:  Bowel sounds positive,abdomen soft and non-tender without masses, organomegaly or hernias noted. Extremities:  No clubbing, cyanosis, edema, or deformity noted with normal full range of motion of all joints.   Neurologic:  No cranial nerve deficits noted. Station and gait are  normal. Plantar reflexes are down-going bilaterally. DTRs are symmetrical throughout. Sensory, motor and coordinative functions appear intact.   Impression & Recommendations:  Problem # 1:  PRE-OPERATIVE CARDIOVASCULAR EXAMINATION (ICD-V72.81) Seems to be good surgical candidate - low risk.  Will check labs, EKG and Xray. 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 Take 1 tablet by mouth once a day  Orders: Comp Met-[facility] [phone]) Lipid-[facility] ([id number])  Problem # 4:  GOITER NOS (ICD-240.9)  Orders: TSH-[facility] ([id number])  Complete Medication List: 1)  Lipitor 20 Mg Tabs (Atorvastatin calcium) .... Take 1 tablet by mouth once a day 2)  Synthroid 100 Mcg Tabs (Levothyroxine sodium) .[name].. 1 tablet by mouth once a day 3)  Omeprazole 20 Mg Tbec (Omeprazole) .[name].. 1 by mouth daily 4)  Alprazolam 0.25 Mg Tabs (Alprazolam) .... As needed 5)  Ambien 10 Mg Tabs (Zolpidem tartrate) .... 1/2 by mouth at bedtime 6)  Aromasin 25 Mg Tabs (Exemestane) .[name].. 1 tab by mouth daily for breast cancer 7)  Ventolin Hfa 108 (90 Base) Mcg/act Aers (Albuterol sulfate) .... 2 puffs inhaled every 4 hours as needed for cough, tight breathing, wheezing  Other Orders: Pneumococcal Vaccine (66440) Admin 1st Vaccine (34742)    Prevention & Chronic Care Immunizations   Influenza vaccine: Historical  (04/18/2009)   Influenza vaccine due: 03/06/2010    Tetanus booster: 02/10/2009: Historical   Tetanus booster due: 01/03/2010    Pneumococcal vaccine: Pneumovax  (10/18/2009)   Pneumococcal vaccine due: None    H. zoster vaccine: Not documented  Colorectal Screening   Hemoccult: Not documented   Hemoccult due: Not Indicated     Colonoscopy: normal  (08/11/2006)   Colonoscopy due: 08/11/2016  Other Screening   Pap smear: approximate date.  Sees gyn  (11/07/2008)   Pap smear due: 11/08/2011    Mammogram: Lt breast probably OK in patient with hx of breast cancer.  [facility]    (03/04/2009)   Mammogram due: 03/04/2010    DXA bone density scan: Not documented   Smoking status: never  (10/18/2009)  Lipids   Total Cholesterol: 149  (08/31/2008)   LDL: 67  (08/31/2008)   LDL Direct: 103  (10/29/2006)   HDL: 66  (08/31/2008)   Triglycerides: 81  (08/31/2008)    SGOT (AST): 16  (08/31/2008)   SGPT (ALT): 18  (08/31/2008) CMP ordered    Alkaline phosphatase: 84  (08/31/2008)   Total bilirubin: 0.3  (08/31/2008)    Lipid flowsheet reviewed?: Yes   Progress toward LDL goal: At goal  Self-Management Support :   Personal Goals (by the next clinic visit) :      Personal LDL goal: 130  (09/18/2009)    Lipid self-management support: Written self-care plan  (09/18/2009)    Nursing Instructions: Give Pneumovax today     Immunizations Administered:  Pneumonia Vaccine:    Vaccine Type: Pneumovax  Site: right deltoid    Mfr: Merck    Dose: 0.5 ml    Route: IM    Given by: Gladstone Pih    Exp. Date: 06/14/2010    Lot #: [id number]    VIS given: 02/01/96 version given October 18, 2009.    Physician counseled: yes

## 2010-08-05 NOTE — Consult Note (Signed)
Summary: [facility]   Imported By: Clydell Hakim 12/20/2009 16:18:06  _____________________________________________________________________  External Attachment:    Type:   Image     Comment:   External Document

## 2010-08-05 NOTE — Assessment & Plan Note (Signed)
Summary: flu shot/kh   Nurse Visit   Vital Signs:  Patient profile:   70 year old female Temp:     98.6 degrees F  Vitals Entered By: Theresia Lo RN (May 09, 2010 11:55 AM)  Allergies: No Known Drug Allergies  Immunizations Administered:  Influenza Vaccine # 1:    Vaccine Type: Fluvax MCR    Site: left deltoid    Mfr: GlaxoSmithKline    Dose: 0.5 ml    Given by: Theresia Lo RN    Exp. Date: 12/31/2010    Lot #: [id number]    VIS given: 01/28/10 version given May 09, 2010.  Flu Vaccine Consent Questions:    Do you have a history of severe allergic reactions to this vaccine? no    Any prior history of allergic reactions to egg and/or gelatin? no    Do you have a sensitivity to the preservative Thimersol? no    Do you have a past history of Guillan-Barre Syndrome? no    Do you currently have an acute febrile illness? no    Have you ever had a severe reaction to latex? no    Vaccine information given and explained to patient? yes    Are you currently pregnant? no  Orders Added: 1)  Influenza Vaccine MCR [00025] 2)  Administration Flu vaccine - MCR [G0008]   Vital Signs:  Patient profile:   70 year old female Temp:     98.6 degrees F  Vitals Entered By: Theresia Lo RN (May 09, 2010 11:55 AM)

## 2010-08-05 NOTE — Miscellaneous (Signed)
Summary: Husband died, Xanax refill  Medications Added ALPRAZOLAM 0.25 MG  TABS (ALPRAZOLAM) as needed       Clinical Lists Changes  Medications: Changed medication from ALPRAZOLAM 0.25 MG  TABS (ALPRAZOLAM) as needed to ALPRAZOLAM 0.25 MG  TABS (ALPRAZOLAM) as needed - Signed Rx of ALPRAZOLAM 0.25 MG  TABS (ALPRAZOLAM) as needed;  #30 x 1;  Signed;  Entered by: Doralee Albino MD;  Authorized by: Doralee Albino MD;  Method used: Printed then faxed to [facility]*, [address] [place], [place]  [id number], Ph: [phone] or [phone], Fax: [phone]    Prescriptions: ALPRAZOLAM 0.25 MG  TABS (ALPRAZOLAM) as needed  #30 x 1   Entered and Authorized by:   Doralee Albino MD   Signed by:   Doralee Albino MD on 06/02/2010   Method used:   Printed then faxed to ...       [facility]* (retail)       [address]       [place], [place]  [id number]       Ph: [phone] or [phone]       Fax: [phone]   RxID:   [id number]

## 2010-08-05 NOTE — Assessment & Plan Note (Signed)
Summary: TO SEE DR Jahmere Bramel PER Nitzia Perren[facility]   Vital Signs:  Patient profile:   70 year old female Height:      63.25 inches Weight:      151.1 pounds BMI:     26.65 Temp:     99.5 degrees F oral Pulse rate:   93 / minute BP sitting:   139 / 72  (left arm) Cuff size:   regular  Vitals Entered By: Gladstone Pih (September 18, 2009 1:35 PM) CC: C/O feels like she has the flu Is Patient Diabetic? No Pain Assessment Patient in pain? no        Primary Care Provider:  Dr. Doralee Albino  CC:  C/O feels like she has the flu.  History of Present Illness: Cough, chills fever.  Feels like the flu.  Muscle aches.  Recent air travel so exposed to crowds.  Breast cancer in remission by all reports.  She feels great.  Habits & Providers  Alcohol-Tobacco-Diet     Tobacco Status: never  Current Medications (verified): 1)  Lipitor 20 Mg Tabs (Atorvastatin Calcium) .... Take 1 Tablet By Mouth Once A Day 2)  Synthroid 100 Mcg Tabs (Levothyroxine Sodium) .[name].. 1 Tablet By Mouth Once A Day 3)  Omeprazole 20 Mg  Tbec (Omeprazole) .[name].. 1 By Mouth Daily 4)  Alprazolam 0.25 Mg  Tabs (Alprazolam) .... As Needed 5)  Ambien 10 Mg  Tabs (Zolpidem Tartrate) .... 1/2 By Mouth At Bedtime 6)  Aromasin 25 Mg Tabs (Exemestane) .[name].. 1 Tab By Mouth Daily For Breast Cancer 7)  Ventolin Hfa 108 (90 Base) Mcg/act Aers (Albuterol Sulfate) .... 2 Puffs Inhaled Every 4 Hours As Needed For Cough, Tight Breathing, Wheezing 8)  Azithromycin 250 Mg  Tabs (Azithromycin) .... 2 By  Mouth Today and Then 1 Daily For 4 Days  Allergies (verified): No Known Drug Allergies  Past History:  Past medical, surgical, family and social histories (including risk factors) reviewed, and no changes noted (except as noted below).  Past Medical History: Reviewed history from 03/09/2008 and no changes required. 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 BTL 1973 - 12/05/1999,  T-score -0.36 - 03/17/2001 L lumpectomy 4/09 Hand surgery in past  prolapsed bladder repair 06 Left total hip replacement 5/10  Family History: Reviewed history from 10/29/2006 and no changes required. - Ca, CVA, ETOHism, depression, + CAD, DM,  Social History: Reviewed history from 08/31/2008 and no changes required. 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  No dyspnea at rest. Neck:  no significant adenopathy Lungs:  Rare exp wheeze.  Crackles in left base Heart:  Normal rate and regular rhythm. S1 and S2 normal without gallop, murmur, click, rub or other extra sounds.   Impression & Recommendations:  Problem # 1:  UNSPECIFIED BACTERIAL PNEUMONIA (ICD-482.9)  Fluids, rest antibiotics.  Red flag symptoms given Her updated medication list for this problem includes:    Azithromycin 250 Mg Tabs (Azithromycin) .[name]... 2 by  mouth  today and then 1 daily for 4 days  Orders: [facility]- Est Level  3 ([address]782)  Complete Medication List: 1)  Lipitor 20 Mg Tabs (Atorvastatin calcium) .... Take 1 tablet by mouth once a day 2)  Synthroid 100 Mcg Tabs (Levothyroxine sodium) .[name].. 1 tablet by mouth once a day 3)  Omeprazole 20 Mg Tbec (Omeprazole) .[name].. 1 by mouth daily 4)  Alprazolam 0.25 Mg Tabs (Alprazolam) .... As needed 5)  Ambien 10 Mg Tabs (Zolpidem tartrate) .... 1/2 by mouth at bedtime 6)  Aromasin 25  Mg Tabs (Exemestane) .[name].. 1 tab by mouth daily for breast cancer 7)  Ventolin Hfa 108 (90 Base) Mcg/act Aers (Albuterol sulfate) .... 2 puffs inhaled every 4 hours as needed for cough, tight breathing, wheezing 8)  Azithromycin 250 Mg Tabs (Azithromycin) .... 2 by  mouth today and then 1 daily for 4 days  Other Orders: Future Orders: Lipid-[facility] ([id number]) ... 09/04/2010 Comp Met-[facility] ([id number]) ... 09/04/2010 TSH-[facility] [id number]) ... 09/04/2010  Patient Instructions: 1)  when you feel better, come back for fasting blood work. 2)  You are due for both pneumonia and shingles vaccines. Prescriptions: SYNTHROID 100 MCG TABS (LEVOTHYROXINE SODIUM) 1 tablet by mouth once a day  #90 x 3   Entered and Authorized by:   Doralee Albino MD   Signed by:   Doralee Albino MD on 09/18/2009   Method used:   Electronically to        [facility]* (retail)       [address]       [place], [place]  [id number]       Ph: [phone] or [phone]       Fax: [phone]   RxID:   [id number] LIPITOR 20 MG TABS (ATORVASTATIN CALCIUM) Take 1 tablet by mouth once a day  #90 x 3   Entered and Authorized by:   Doralee Albino MD   Signed by:   Doralee Albino MD on 09/18/2009   Method used:   Electronically to        [facility]* (retail)       [address]       [place], [place]  [address]       Ph: [phone] or [phone]       Fax: [phone]   RxID:   [id number] AZITHROMYCIN 250 MG  TABS (AZITHROMYCIN) 2 by  mouth today and then 1 daily for 4 days  #6 x 0   Entered and Authorized by:   Doralee Albino MD   Signed by:   Doralee Albino MD on 09/18/2009   Method used:   Electronically to        [facility]* (retail)       [address]       [place], [place]  [address]       Ph: [phone] or [phone]       Fax: [phone]   RxID:   [id number]    Immunization History:  Tetanus/Td Immunization History:    Tetanus/Td:  historical  (02/10/2009)  Influenza Immunization History:    Influenza:  historical (04/18/2009)   Prevention & Chronic Care Immunizations   Influenza vaccine: Historical  (04/18/2009)   Influenza vaccine due: 03/06/2010    Tetanus booster: 02/10/2009: Historical   Tetanus booster due: 01/03/2010    Pneumococcal vaccine: Done.  (10/05/2002)   Pneumococcal vaccine due: None    H. zoster vaccine: Not documented  Colorectal Screening   Hemoccult: Not documented  Hemoccult due: Not Indicated    Colonoscopy: normal  (08/11/2006)   Colonoscopy due: 08/11/2016  Other Screening   Pap smear: approximate date.  Sees gyn  (11/07/2008)   Pap smear due: 11/08/2011    Mammogram: Lt breast probably OK in patient with hx of breast cancer.  [facility].    (03/04/2009)   Mammogram due: 03/04/2010    DXA bone density scan: Not documented   Smoking status: never  (09/18/2009)  Lipids   Total Cholesterol: 149  (08/31/2008)   LDL: 67  (08/31/2008)   LDL Direct: 103  (10/29/2006)   HDL: 66  (08/31/2008)   Triglycerides: 81  (08/31/2008)    SGOT (AST): 16  (08/31/2008)   SGPT (ALT): 18  (08/31/2008) CMP ordered    Alkaline phosphatase: 84  (08/31/2008)   Total bilirubin: 0.3  (08/31/2008)    Lipid flowsheet reviewed?: Yes   Progress toward LDL goal: At goal  Self-Management Support :   Personal Goals (by the next clinic visit) :      Personal LDL goal: 130  (09/18/2009)    Lipid self-management support: Written self-care plan  (09/18/2009)   Lipid self-care plan printed.     Pap Smear  Procedure date:  11/07/2008  Findings:      approximate date.  Sees gyn   Pap Smear  Procedure date:  11/07/2008  Findings:      approximate date.  Sees gyn

## 2010-08-05 NOTE — Letter (Signed)
Summary: [facility]  [facility]   Imported By: Knox Royalty 01/25/2010 12:40:30  _____________________________________________________________________  External Attachment:    Type:   Image     Comment:   External Document

## 2010-08-07 NOTE — Miscellaneous (Signed)
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;  ETOH 1 glass wine qd;  diet generally healthy;  exercises 3x/wk: not now with bad left hip Husband died 06-11-23 (July 13, 2010 12:06) Added new observation of PAPRECACT: Not indicated-other (2010/07/13 12:06) Added new observation of TDBOOSTDUE: 02/11/2019 (13-Jul-2010 12:06) Added new observation of DM PROGRESS: N/A (07/13/10 12:06) Added new observation of DM FSREVIEW: N/A (2010/07/13 12:06) Added new observation of HTN PROGRESS: N/A (07/13/2010 12:06) Added new observation of HTN FSREVIEW: N/A (July 13, 2010 12:06)      Prevention & Chronic Care Immunizations   Influenza vaccine: Fluvax MCR  (05/09/2010)   Influenza vaccine due: 03/06/2010    Tetanus booster: 02/10/2009: Historical   Tetanus booster due: 02/11/2019    Pneumococcal vaccine: Pneumovax  (10/18/2009)   Pneumococcal vaccine due: None    H. zoster vaccine: Not documented  Colorectal Screening   Hemoccult: Not documented   Hemoccult due: Not Indicated    Colonoscopy: normal  (08/11/2006)   Colonoscopy due: 08/11/2016  Other Screening   Pap smear: approximate date.  Sees gyn  (11/07/2008)   Pap smear action/deferral: Not indicated-other  (07-13-10)   Pap smear due: 11/08/2011    Mammogram: Assessment: BIRADS 2.   (03/06/2010)   Mammogram due: 03/2011    DXA bone density scan: Not documented   Smoking status: never  (10/18/2009)  Lipids   Total Cholesterol: 141  (10/18/2009)   LDL: 70  (10/18/2009)   LDL Direct: 103  (10/29/2006)   HDL: 58  (10/18/2009)   Triglycerides: 65  (10/18/2009)    SGOT (AST): 21  (10/18/2009)   SGPT (ALT): 25  (10/18/2009)   Alkaline phosphatase: 82  (10/18/2009)   Total bilirubin: 0.5  (10/18/2009)  Self-Management Support :   Personal Goals (by the next clinic visit) :      Personal LDL  goal: 130  (09/18/2009)    Lipid self-management support: Written self-care plan  (09/18/2009)    Allergies: No Known Drug Allergies   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

## 2010-08-07 NOTE — Assessment & Plan Note (Signed)
Summary: pain to rt side/bmc   Vital Signs:  Patient profile:   70 year old female Height:      63.25 inches Weight:      142.8 pounds BMI:     25.19 Temp:     98.4 degrees F oral Pulse rate:   55 / minute BP sitting:   146 / 78  (left arm) Cuff size:   regular  Vitals Entered By: Jimmy Footman, CMA (June 18, 2010 3:49 PM)   Primary Care Provider:  Dr. Doralee Albino   History of Present Illness: Rt upper quadrent pain.  No clear pattern - not worsened or improved by meals.  Pain is mild.  No associated nausea, vomiting fever or chills Coping well with recent death of husband.  Lots of family/friend support.  Will take advantage of hospice grief counciling. Cell phone [phone]  Current Medications (verified): 1)  Lipitor 20 Mg Tabs (Atorvastatin Calcium) .... Take 1 Tablet By Mouth Once A Day 2)  Synthroid 100 Mcg Tabs (Levothyroxine Sodium) .[name].. 1 Tablet By Mouth Once A Day 3)  Omeprazole 20 Mg  Tbec (Omeprazole) .[name].. 1 By Mouth Daily 4)  Alprazolam 0.25 Mg  Tabs (Alprazolam) .... As Needed 5)  Ambien 10 Mg  Tabs (Zolpidem Tartrate) .... 1/2 By Mouth At Bedtime and A Second  1/2 Tab After Flushing Tube Feed 4 Hours Later. 6)  Aromasin 25 Mg Tabs (Exemestane) .[name].. 1 Tab By Mouth Daily For Breast Cancer 7)  Ventolin Hfa 108 (90 Base) Mcg/act Aers (Albuterol Sulfate) .... 2 Puffs Inhaled Every 4 Hours As Needed For Cough, Tight Breathing, Wheezing  Allergies (verified): No Known Drug Allergies  Past History:  Past medical, surgical, family and social histories (including risk factors) reviewed, and no changes noted (except as noted below).  Past Medical History: Reviewed history from 03/09/2008 and no changes required. 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 BTL 1973 - 12/05/1999,  T-score -0.36 - 03/17/2001 L lumpectomy 4/09 Hand surgery in past  prolapsed bladder repair 06 Left total hip replacement 5/10  Family History: Reviewed history from 10/29/2006 and no changes required. - Ca, CVA, ETOHism, depression, + CAD, DM,  Social History: Reviewed history from 06/18/2010 and no changes required. 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 Lungs are clear to auscultation, no crackles or wheezes. Heart:  Normal rate and regular rhythm. S1 and S2 normal without gallop, murmur, click, rub or other extra sounds. Abdomen:  + Murphy's sign with RTUQ pain on deep palpation   Impression & Recommendations:  Problem # 1:  ABDOMINAL PAIN (ICD-789.00) Stongly suspect GB disease  Will call with test results. Orders: Comp Met-[facility] [phone]) CBC-[facility] (95638) Ultrasound (Ultrasound) [facility]- Est Level  3 (75643)  Complete Medication List: 1)  Lipitor 20 Mg Tabs (Atorvastatin calcium) .... Take 1 tablet by mouth once a day 2)  Synthroid 100 Mcg Tabs (Levothyroxine sodium) .Marland KitchenMarland KitchenMarland Kitchen 1  tablet by mouth once a day 3)  Omeprazole 20 Mg Tbec (Omeprazole) .[name].. 1 by mouth daily 4)  Alprazolam 0.25 Mg Tabs (Alprazolam) .... As needed 5)  Ambien 10 Mg Tabs (Zolpidem tartrate) .... 1/2 by mouth at bedtime and a second  1/2 tab after flushing tube feed 4 hours later. 6)  Aromasin 25 Mg Tabs (Exemestane) .[name].. 1 tab by mouth daily for  breast cancer 7)  Ventolin Hfa 108 (90 Base) Mcg/act Aers (Albuterol sulfate) .... 2 puffs inhaled every 4 hours as needed for cough, tight breathing, wheezing   Orders Added: 1)  Comp Met-[facility] [[id number]] 2)  CBC-[facility] [85027] 3)  Ultrasound [Ultrasound] 4)  [facility]- Est Level  3 [47829]     Prevention & Chronic Care Immunizations   Influenza vaccine: Fluvax MCR  (05/09/2010)   Influenza vaccine due: 03/07/2011    Tetanus booster: 02/10/2009: Historical   Tetanus booster due: 02/11/2019    Pneumococcal vaccine: Pneumovax  (10/18/2009)   Pneumococcal vaccine due: None    H. zoster vaccine: Not documented  Colorectal Screening   Hemoccult: Not documented   Hemoccult due: Not Indicated    Colonoscopy: normal  (08/11/2006)   Colonoscopy due: 08/11/2016  Other Screening   Pap smear: approximate date.  Sees gyn  (11/07/2008)   Pap smear action/deferral: Not indicated-other  (06/18/2010)   Pap smear due: 11/08/2011    Mammogram: Assessment: BIRADS 2.   (03/06/2010)   Mammogram due: 03/2011    DXA bone density scan: Not documented   Smoking status: never  (10/18/2009)  Lipids   Total Cholesterol: 141  (10/18/2009)   LDL: 70  (10/18/2009)   LDL Direct: 103  (10/29/2006)   HDL: 58  (10/18/2009)   Triglycerides: 65  (10/18/2009)    SGOT (AST): 21  (10/18/2009)   SGPT (ALT): 25  (10/18/2009) CMP ordered    Alkaline phosphatase: 82  (10/18/2009)   Total bilirubin: 0.5  (10/18/2009)    Lipid flowsheet reviewed?: Yes   Progress toward LDL goal: At goal  Self-Management Support :   Personal Goals (by the next clinic visit) :      Personal LDL goal: 130  (09/18/2009)    Lipid self-management support: Written self-care plan  (09/18/2009)

## 2010-09-16 ENCOUNTER — Encounter: Payer: Self-pay | Admitting: Home Health Services

## 2010-09-22 LAB — CBC
HCT: 35.8 % — ABNORMAL LOW (ref 36.0–46.0)
HCT: 36.6 % (ref 36.0–46.0)
HCT: 40 % (ref 36.0–46.0)
Hemoglobin: 12.6 g/dL (ref 12.0–15.0)
Hemoglobin: 12.8 g/dL (ref 12.0–15.0)
MCHC: 35.1 g/dL (ref 30.0–36.0)
MCV: 93.6 fL (ref 78.0–100.0)
MCV: 94 fL (ref 78.0–100.0)
Platelets: 256 10*3/uL (ref 150–400)
RDW: 13.7 % (ref 11.5–15.5)
RDW: 14.1 % (ref 11.5–15.5)
WBC: 10.3 10*3/uL (ref 4.0–10.5)
WBC: 5.7 10*3/uL (ref 4.0–10.5)

## 2010-09-22 LAB — URINALYSIS, ROUTINE W REFLEX MICROSCOPIC
Glucose, UA: NEGATIVE mg/dL
Hgb urine dipstick: NEGATIVE
pH: 6.5 (ref 5.0–8.0)

## 2010-09-22 LAB — BASIC METABOLIC PANEL
BUN: 13 mg/dL (ref 6–23)
Creatinine, Ser: 0.65 mg/dL (ref 0.4–1.2)
GFR calc non Af Amer: >60 mL/min (ref >60)
Glucose, Bld: 91 mg/dL (ref 70–99)
Potassium: 3.6 mEq/L (ref 3.5–5.1)

## 2010-09-22 LAB — URINE MICROSCOPIC-ADD ON

## 2010-09-22 LAB — PROTIME-INR: Prothrombin Time: 13.6 seconds (ref 11.6–15.2)

## 2010-09-22 LAB — ABO/RH: ABO/RH(D): O POS

## 2010-09-22 LAB — APTT: aPTT: 27 seconds (ref 24–37)

## 2010-09-22 LAB — TYPE AND SCREEN: ABO/RH(D): O POS

## 2010-09-25 ENCOUNTER — Other Ambulatory Visit: Payer: Self-pay | Admitting: Family Medicine

## 2010-09-25 NOTE — Telephone Encounter (Signed)
Refill request

## 2010-10-14 LAB — BASIC METABOLIC PANEL
BUN: 7 mg/dL (ref 6–23)
BUN: 9 mg/dL (ref 6–23)
Chloride: 101 mEq/L (ref 96–112)
Chloride: 102 mEq/L (ref 96–112)
Creatinine, Ser: 0.53 mg/dL (ref 0.4–1.2)
Glucose, Bld: 114 mg/dL — ABNORMAL HIGH (ref 70–99)
Glucose, Bld: 116 mg/dL — ABNORMAL HIGH (ref 70–99)
Potassium: 4.1 mEq/L (ref 3.5–5.1)

## 2010-10-14 LAB — CBC
HCT: 24.8 % — ABNORMAL LOW (ref 36.0–46.0)
Hemoglobin: 8.6 g/dL — ABNORMAL LOW (ref 12.0–15.0)
MCV: 91.5 fL (ref 78.0–100.0)
MCV: 92.2 fL (ref 78.0–100.0)
Platelets: 182 10*3/uL (ref 150–400)
Platelets: 194 10*3/uL (ref 150–400)
RDW: 13.2 % (ref 11.5–15.5)
RDW: 13.6 % (ref 11.5–15.5)
WBC: 6.6 10*3/uL (ref 4.0–10.5)
WBC: 7.4 10*3/uL (ref 4.0–10.5)

## 2010-10-14 LAB — TYPE AND SCREEN

## 2010-10-14 LAB — PREPARE RBC (CROSSMATCH)

## 2010-10-15 LAB — CBC
HCT: 35.9 % — ABNORMAL LOW (ref 36.0–46.0)
MCHC: 34.5 g/dL (ref 30.0–36.0)
MCV: 91.3 fL (ref 78.0–100.0)
Platelets: 276 10*3/uL (ref 150–400)

## 2010-10-15 LAB — URINALYSIS, ROUTINE W REFLEX MICROSCOPIC
Bilirubin Urine: NEGATIVE
Glucose, UA: NEGATIVE mg/dL
Hgb urine dipstick: NEGATIVE
Ketones, ur: NEGATIVE mg/dL
Protein, ur: NEGATIVE mg/dL
pH: 5.5 (ref 5.0–8.0)

## 2010-10-15 LAB — DIFFERENTIAL
Basophils Relative: 0 % (ref 0–1)
Eosinophils Absolute: 0.1 10*3/uL (ref 0.0–0.7)
Eosinophils Relative: 2 % (ref 0–5)
Lymphs Abs: 0.8 10*3/uL (ref 0.7–4.0)
Neutrophils Relative %: 74 % (ref 43–77)

## 2010-10-15 LAB — BASIC METABOLIC PANEL
BUN: 16 mg/dL (ref 6–23)
CO2: 28 mEq/L (ref 19–32)
Chloride: 105 mEq/L (ref 96–112)
Creatinine, Ser: 0.66 mg/dL (ref 0.4–1.2)
Glucose, Bld: 97 mg/dL (ref 70–99)
Potassium: 4.1 mEq/L (ref 3.5–5.1)

## 2010-10-15 LAB — PROTIME-INR
INR: 1 (ref 0.00–1.49)
Prothrombin Time: 13.1 seconds (ref 11.6–15.2)

## 2010-10-28 ENCOUNTER — Ambulatory Visit (INDEPENDENT_AMBULATORY_CARE_PROVIDER_SITE_OTHER): Payer: Medicare Other | Admitting: Home Health Services

## 2010-10-28 ENCOUNTER — Encounter: Payer: Self-pay | Admitting: Home Health Services

## 2010-10-28 VITALS — BP 150/77 | HR 88 | Temp 98.1°F | Ht 63.5 in | Wt 139.7 lb

## 2010-10-28 DIAGNOSIS — Z0000 Encounter for general adult medical examination without abnormal findings: Secondary | ICD-10-CM

## 2010-10-28 NOTE — Progress Notes (Signed)
Patient here for annual wellness visit, patient reports: Risk Factors/Conditions needing evaluation or treatment: Patient does not have any risk factors that need evaluation. Home Safety: Patient lives in 2 story home by her self.  Patient reports having smoke detectors and adaptive equipment in the bathroom.  Other Information: Corrective lens: Patient wears corrective lens daily and visits eye doctor every 6 months. Dentures: Patient does not have dentures and visits dentist annually. Memory: Patient reports some memory loss. Patient's Mini Mental Score (recorded in doc. flowsheet): 30  Balance max value patientvalue  Sitting balance 1 1  Arise 2 1  Attempts to arise 2 2  Immediate standing balance 2 2  Standing balance 1 1  Nudge 2 2  Eyes closed 1 1  360 degree turn 1 1  Sitting down 2 2   Gait max value patient value  Initiation of gait 1 1  Step length-left 1 1  Step length-right 1 1  Step height-left 1 1  Step height-right 1 1  Step symmetry 1 1  Step continuity 1 1  Path 2 2  Trunk 2 2  Walking stance 1 1   Balance/Gait Score: 25/26    Annual Wellness Visit Requirements Recorded Today In  Medical, family, social history Past Medical, Family, Social History Section  Current providers Care team  Current medications Medications  Wt, BP, Ht, BMI Vital signs  Hearing assessment (welcome visit) declined  Tobacco, alcohol, illicit drug use History  ADL Nurse Assessment  Depression Screening Nurse Assessment  Cognitive impairment Nurse Assessment  Mini Mental Status Document Flowsheet  Fall Risk Nurse Assessment  Home Safety Progress Note  End of Life Planning (welcome visit) Social Documentation  Medicare preventative services Progress Note  Risk factors/conditions needing evaluation/treatment Progress Note  Personalized health advice Patient Instructions, goals, letter  Diet & Exercise Social Documentation  Emergency Contact Social Documentation  Seat Belts  Social Documentation  Sun exposure/protection Social Documentation    Prevention Plan: Recommended patient follow up with pharmacy for shingles vaccine.  Recommended Medicare Prevention Screenings Women over 84 Test For Frequency Date of Last- BOLD if needed  Breast Cancer 1-2 yrs 9/11  Cervical Cancer 1-3 yrs 2010  Colorectal Cancer 1-10 yrs 2/08  Osteoporosis once 2011-patient reported  Cholesterol 5 yrs 4/11  Diabetes yearly Non diabetic  HIV yearly declined  Influenza Shot yearly 11/11  Pneumonia Shot once 4/11   Zostavax Shot once recommended

## 2010-10-28 NOTE — Patient Instructions (Signed)
1. Follow up with pharmacy for shingles vaccine. 2. Continue to go to support groups and if you feel abnormal sadness for longer than 2 weeks, schedule an appointment with Dr. Leveda Anna.  3. Continue walking 4-5 times a week.

## 2010-10-28 NOTE — Progress Notes (Signed)
  Subjective:    Patient ID: Margaret [name], female    DOB: February 13, 1941, 70 y.o.   MRN: [id number]  HPI I have reviewed this visit and discussed with Arlys John and agree with her documentation.      Review of Systems     Objective:   Physical Exam        Assessment & Plan:

## 2010-11-18 NOTE — Op Note (Signed)
NAMETAMMY, WICKLIFFE              ACCOUNT NO.:  [id number]   MEDICAL RECORD NO.:  [id number]          PATIENT TYPE:  OIB   LOCATION:  2550                         FACILITY:  [facility]   PHYSICIAN:  Currie Paris, M.D.DATE OF BIRTH:  June 30, 1941   DATE OF PROCEDURE:  10/10/2007  DATE OF DISCHARGE:  09/23/2007                               OPERATIVE REPORT   OFFICE MEDICAL RECORD NUMBER [id number].   PREOPERATIVE DIAGNOSIS:  Stage II carcinoma, left breast, lower inner  quadrant.   POSTOPERATIVE DIAGNOSIS:  Stage II carcinoma, left breast, lower inner  quadrant.   OPERATION:  Left partial mastectomy with axillary dissection; Port-A-  Cath placement.   SURGEON:  Currie Paris, M.D.   ANESTHESIA:  General.   CLINICAL HISTORY:  This patient has a palpable mass in the lower inner  quadrant of the left breast which was biopsy-proven carcinoma.  This was  apparently the only area of abnormality.  She did have, however, an  abnormality palpable in her axilla and a biopsy showed carcinoma there  as well.  She had a preoperative evaluation and was going to need  chemotherapy, and so she wished to have a Port-A-Cath placement for  postoperative chemotherapy.   DESCRIPTION OF PROCEDURE:  The patient was seen in the holding area.  She had no further questions.  We confirmed the left breast as the  operative side and that we were planning to do a left partial mastectomy  (lumpectomy) and axillary dissection as well as place a Port-A-Cath.  The patient had no further questions.   The patient was taken to the operating room.  After satisfactory general  anesthesia had been obtained, the breasts were prepped and draped as a  sterile field.  The timeout was done.   The palpable mass on the left breast was marked.  And elliptical  incision radially-oriented was outlined and made and then curved around  the areolar edge.  Using skin hooks, I raised some fairly thin skin  flaps since this  tumor felt close to surface but I felt that we were  well away from it.  I took a wide excision down to muscle in all  directions around the tumor and felt that I was completely around it all  directions by palpation.  This was sent for pathology to look at the  gross margins and they currently appear to have all negative margins.   This incision was closed in layers with 3-0 Vicryl and 4-0 Monocryl  subcuticular plus Dermabond.  Everything was dry prior to closing.   Attention was turned to the left axilla and a transverse axillary  incision made and subcutaneous tissues divided until we had the axillary  fat pad and then down to the chest wall, exposing the lateral border of  the pectoralis major and trying to carry this over to the latissimus.  I  opened the clavipectoral fascia and tried to dissect out the tissue in  the axilla, pulling and tugging until we got some of the fatty tissue to  separate out and I could identify the axillary vein.  I  stripped the  contents from medial to lateral and superior to inferior.  There were  several palpable nodes contained in the tissue I took out.  We  identified the long thoracic and thoracodorsal nerves and preserved  those and dissected out tissue in between and finally divided the  lateral attachments to the latissimus.   I spent several minutes irrigating, making sure everything was dry.  We  used clips and cautery primarily for hemostasis.   A #19 Blake drain was placed percutaneously through a separate incision  and secured with 2-0 nylon.  The incision was closed with 3-0 Vicryl and  4-0 Monocryl subcuticular plus Dermabond.   Instruments, counts, gloves, etc., were all changed and the patient  repositioned with her arms to her side and upper chest/lower neck were  prepped for the Port-A-Cath.  Another timeout was performed.   The subclavian vein on the right side was accessed on the second  attempt.  On the first attempt, I had  gotten an arterial puncture and  held pressure for approximately five minutes, and there was no  significant bleeding or hematoma.   Once I got into the vein, I threaded the guidewire in and  fluoroscopically threaded it in the superior vena cava.   A skin incision was made in the pocket fashion with cautery for the [facility]-  A-Cath's reservoir.  With the patient still in Trendelenburg, the  guidewire tract was dilated using fluoroscopy to confirm the dilator  going over the guidewire.  The guidewire and dilator were removed and  the catheter threaded to 23 cm.  The peel-away sheath was removed.  The  catheter was backed up to 15 cm, at which point it appeared to be in the  distal superior vena cava.  It aspirated and flushed easily.  The  reservoir was flushed, attached, and a locking mechanism engaged.  It  aspirated and flushed easily.  This secured to the fascia with some 2-0  Prolene.   Final fluoroscopy was made, showing good positioning and no kinks.  The  catheter was then flushed with a concentrated aqueous heparin.  The  incision was closed with 3-0 Vicryl and 4-0 Monocryl subcuticular plus  Dermabond.   The patient tolerated the procedure well and there no complications.  All counts were correct.      Currie Paris, M.D.  Electronically Signed     CJS/MEDQ  D:  10/10/2007  T:  10/10/2007  Job:  [id number]   cc:   M. [name], MD

## 2010-11-18 NOTE — Op Note (Signed)
NAMEARRIONNA, Margaret [name]              ACCOUNT NO.:  [id number]   MEDICAL RECORD NO.:  [id number]          PATIENT TYPE:  INP   LOCATION:  0002                         [facility]ACILITY:  [facility]   PHYSICIAN:  Madlyn Frankel. Charlann Boxer, M.D.  DATE OF BIRTH:  08/03/1940   DATE OF PROCEDURE:  11/06/2008  DATE OF DISCHARGE:                               OPERATIVE REPORT   PREOPERATIVE DIAGNOSIS:  Left hip osteoarthritis.   POSTOPERATIVE DIAGNOSIS:  Left hip osteoarthritis.   PROCEDURE:  Left total hip replacement.   COMPONENTS USED:  DePuy Hip System with size 54 pinnacle cup and a 6  high Trilock stem with a 36 metal liner and 36/1.5 ball.   SURGEON:  Madlyn Frankel. Charlann Boxer, M.D.   ASSISTANT:  Dwyane Luo, PA-C.   ANESTHESIA:  Spinal.   BLOOD LOSS:  300 mL.   DRAINS:  One Hemovac.   COMPLICATIONS:  None.   FINDINGS:  None.   SPECIMEN:  None.   INDICATIONS FOR THE PROCEDURE:  Ms. Margaret [name] is a pleasant 70 year old  female who presented to the office for evaluation of left hip pain.  Radiographs at that time revealed advanced arthritis.  Her symptoms  progressively worsened and were not responding to conservative measures.  She thus wished to proceed with arthroplasty.  I reviewed the risk of  infection, DVT, component failure, need for revision surgery.  Consent  was obtained for the benefit of pain relief.   PROCEDURE IN DETAIL:  The patient was brought to the operative theater.  Once adequate anesthesia was established, preoperative antibiotics  administered, the patient was positioned into the right lateral  decubitus position with the left side up.  Left lower extremity was then  pre scrubbed, prepped and draped in a sterile fashion.  Time-out was  performed identifying the patient, planned procedure and the extremity.  Lateral based incision was then made for posterior approach to the hip.  The iliotibial band and gluteal fascia were dissected out and then  incised posteriorly.  The short external  rotators were taken down  preserving the piriformis.   An L capsulotomy was then performed preserving the posterior leaflet to  later anatomic repair, but also to protect it from the sciatic nerve  posteriorly.  The hip was dislocated.  A neck osteotomy was made based  off the anatomic landmarks utilizing a broach, trial neck and head.   Following this, attention was directed to the femur.  I used a box  osteotome to open up some of the lateral neck area.  I then used the  starting drill, hand reamed it once and then irrigated the canal.  I  then broached setting anteversion pretty much close to native around 20  degrees up to a size 6 which sat just a millimeter set proud from where  the neck cut was.  Based on this, I went ahead and packed off the femur  and attended to the acetabulum.   Acetabular exposure was obtained, labrum debrided.  I began reaming with  a 45 reamer and reamed up to 53 reamer with good bony support both  anterior and  posterior with a good bony bed preparation with nice  bleeding bone.  A final 54 pinnacle cup was then impacted, sitting at  about 35-40 degrees of abduction and 20 degrees of full flexion  anatomically positioned between the columns.  I checked with the hip  guide, was satisfied with position, placed a single cancellous screw to  support the initial scratch fit.   A trial liner was now placed and a trial reduction carried out.  The 6  standard neck was initially used.  There was little bit of impingement  anteriorly with about 70 degrees of internal rotation, so I thus changed  to a high offset which improved this.  Her leg lengths appeared to be  identical to how I positioned her in the preoperative state and there  was about a millimeter shuck.  Given all these parameters, the trial  components were removed.  A central hole eliminator was placed and the  final 36 metal liner impacted with good secure rim fit  circumferentially.   At this  point the final 6 high Trilock stem was then impacted.  It sat  level where the broach had.  I went ahead and trialed again to confirm  the stability and leg lengths.  The final 36/1.5 ball was then impacted  onto a clean and dry trunnion.  The leg lengths remained equal as  compared to the preoperative positioning.  Her hip stability was  excellent with hip flexion instrumentation to 80 degrees without  evidence of internal impingement.  No evidence of impingement with  external rotation and extension.   We irrigated the hip throughout the case.  At this point I  reapproximated the posterior capsule and superior leaflet.  I then  placed a medium Hemovac drain deep.  The iliotibial band and gluteal  fascia were then reapproximated using #1 Vicryl.  The remainder of the  wound was closed in layers with 2-0 Vicryl and a running 4-0 Monocryl.  Hip was cleaned, dried and dressed sterilely with Steri-Strips and a  Mediplex dressing.  She was then brought to the recovery room in stable  condition tolerating the procedure well.      Madlyn Frankel Charlann Boxer, M.D.  Electronically Signed     MDO/MEDQ  D:  11/06/2008  T:  11/06/2008  Job:  [id number]

## 2010-11-18 NOTE — Op Note (Signed)
NAMERACHAL, DVORSKY              ACCOUNT NO.:  [id number]   MEDICAL RECORD NO.:  [id number]          PATIENT TYPE:  AMB   LOCATION:  DSC                          FACILITY:  [facility]   PHYSICIAN:  Currie Paris, M.D.DATE OF BIRTH:  01-Feb-1941   DATE OF PROCEDURE:  01/10/2009  DATE OF DISCHARGE:                               OPERATIVE REPORT   PREOPERATIVE DIAGNOSIS:  Unneeded Port-A-Cath.   POSTOPERATIVE DIAGNOSIS:  Unneeded Port-A-Cath.   OPERATION:  Port-A-Cath removal.   SURGEON:  Currie Paris, MD   ANESTHESIA:  Local.   CLINICAL HISTORY:  This is a 70 year old lady who has completed her  neoadjuvant chemotherapy and wished to have her port removed.   The patient was seen in the minor procedure room and we confirmed Port-A-  Cath removal as procedure and that the site was in the right anterior  chest wall, subclavicular area.   The area was then anesthetized with 1% Xylocaine with epi.  I waited 10  minutes.  The area was then prepped with Betadine.  Sterile drape was  done.   The old incision was opened with a knife and the port identified.  The 2  Prolene holding sutures at the front were cut and the Port-A-Cath tubing  backed up a little bit.  I was then able to cut the remaining suture at  the back into the port, manipulate the entire port out of the wound.   I put a 3-0 Vicryl along the tract and then backed the tubing out and  tied the suture down.   There was no bleeding.  The port came out intact.   The incision was closed with 3-0 Vicryl, 4-0 Monocryl subcuticular plus  Dermabond.  The patient tolerated the procedure well and there were no  complications.      Currie Paris, M.D.  Electronically Signed     CJS/MEDQ  D:  01/10/2009  T:  01/10/2009  Job:  [id number]

## 2010-11-18 NOTE — Discharge Summary (Signed)
Margaret [name], Margaret [name]              ACCOUNT NO.:  [id number]   MEDICAL RECORD NO.:  [id number]          PATIENT TYPE:  INP   LOCATION:  1611                         FACILITY:  [facility]   PHYSICIAN:  Madlyn Frankel. Charlann Boxer, M.D.  DATE OF BIRTH:  1941-07-02   DATE OF ADMISSION:  11/06/2008  DATE OF DISCHARGE:  11/09/2008                               DISCHARGE SUMMARY   ADMISSION DIAGNOSES:  1. Osteoarthritis.  2. Breast cancer.  3. Hypercholesterolemia.  4. Pulmonary fibrosis, post inflammatory.   DISCHARGE DIAGNOSES:  1. Osteoarthritis.  2. Breast cancer.  3. Hypercholesterolemia.  4. Pulmonary fibrosis, post inflammatory.  5. Acute blood loss anemia.   HISTORY OF PRESENT ILLNESS:  A 70 year old female with a history of left  hip pain secondary to osteoarthritis refractory to all conservative  treatment.   CONSULTATIONS:  None.   PROCEDURE:  Left total hip replacement by Dr. Durene Romans, assistant  Dwyane Luo, PA-C.   LABORATORY DATA:  CBC final reading white blood cell count 7.5,  hemoglobin 10.4, hematocrit 29.6, platelet count 182,000.  White cell  differential, no significant abnormalities.  INR was normal.  Metabolic  panel:  Sodium 132, potassium 3.9, BUN 7, creatinine 0.53, glucose 114,  calcium 7.8.  Urinalysis was negative.   HOSPITAL COURSE:  The patient was admitted to the hospital and underwent  a left total hip replacement.  During her course of stay she did have  acute blood loss anemia and was transfused a couple of units.  She  stabilized afterwards.  Otherwise, she did well.  Orthopedically she  remained stable and made good progress with improving strength as well  as ambulation distance. Dressing was changed on day #3, no drainage from  the wound.  She was stable neurovascularly and hemodynamically and ready  for discharge home with home health care physical therapy.   DISPOSITION:  Discharged to home with home health care physical therapy.   DISCHARGE DIET:   Heart healthy.   WOUND CARE:  Keep dry.   DISCHARGE MEDICATIONS:  1. Lovenox 40 mg subcutaneously q.24h. x10 days.  2. Robaxin 500 mg one p.o. q.6h. p.r.n. muscle spasm or pain.  3. Iron 325 mg one p.o. t.i.d. x2 weeks.  4. Colace 100 mg p.o. b.i.d.  5. MiraLax 17 grams p.o. daily p.r.n.  6. After Lovenox is completed, start enteric coated aspirin 325 mg one      p.o. daily x 4 weeks.  7. Norco 7.5/325 one to two p.o. q.4-6h. p.r.n. pain.  8. Synthroid 100 mcg p.o. nightly.  9. Omeprazole 20 mg two tabs q.a.m.  10.Aromasin 25 mg p.o. q.a.m.  11.Enablex 7.5 mg p.o. q.a.m.  12.Zolpidem 10 mg p.o. nightly.  13.Lipitor 20 mg p.o. nightly.  14.Calcium plus vitamin D daily.  15.Multivitamin daily.  16.B complex daily.  17.Biotin daily.  18.Coumadin 1 mg p.o. q.a.m., stop and hold all Lovenox and aspirin,      restart afterwards.   DISCHARGE FOLLOWUP:  Follow up with Dr. Charlann Boxer at phone number [phone] in  2 weeks for a wound check.   ACTIVITY:  Discharge physical therapy,  weight-bearing as tolerated.     ______________________________  Yetta Glassman. [address]oreta [address], [place]      Madlyn Frankel. Charlann Boxer, M.D.  Electronically Signed    BLM/MEDQ  D:  11/26/2008  T:  11/26/2008  Job:  [id number]   cc:   William A. Leveda Anna, M.D.   Charlcie Cradle Delford Field, MD, FCCP  [address]  [place]  [place] [address]

## 2010-11-18 NOTE — H&P (Signed)
Margaret [name], Margaret [name]              ACCOUNT NO.:  [id number]   MEDICAL RECORD NO.:  [id number]          PATIENT TYPE:  INP   LOCATION:  NA                           FACILITY:  [facility]   PHYSICIAN:  Madlyn Frankel. Charlann Boxer, M.D.  DATE OF BIRTH:  06/29/1941   DATE OF ADMISSION:  DATE OF DISCHARGE:                              HISTORY & PHYSICAL   PROCEDURE:  Left total hip placement.   CHIEF COMPLAINT:  Left hip pain.   HISTORY OF PRESENT ILLNESS:  A 70 year old female with a history of left  hip pain secondary to osteoarthritis refractory to all conservative  treatment.   PAST MEDICAL HISTORY:  1. Osteoarthritis.  2. Breast cancer with lumpectomy.  3. Hypercholesterolemia.  4. Pulmonary fibrosis postinflammatory.   PAST SURGICAL HISTORY:  Lumpectomy.   FAMILY HISTORY:  Coronary artery disease, diabetes, CVA.   SOCIAL HISTORY:  Primary caregiver will be Mr. __________ Manson Passey in the  home postoperatively.   DRUG ALLERGIES:  No known drug allergies.   MEDICATIONS:  1. Diclofenac 100 mg p.o. daily.  2. Lipitor 20 mg 1 p.o. daily.  3. Synthroid 100 mcg p.o. daily.  4. Omeprazole 20 mg 1 p.o. daily.  5. Alprazolam 0.25 mg p.r.n.  6. Ambien 10 mg 1/2 tablet by mouth at bedtime.  7. Vicodin 5/500 one p.o. q.6 h. p.r.n. pain.   PRIMARY CARE PHYSICIAN:  Dr. Doralee Albino at [facility].   OTHER PHYSICIANS:  Dr. Delford Field, pulmonologist.   REVIEW OF SYSTEMS:  None other than HPI.   PHYSICAL EXAMINATION:  Pulse 72, respirations 16, blood pressure 130/70.  GENERAL:  Awake, alert and oriented.  HEENT: Normocephalic.  NECK:  Supple.  No carotid bruits.  CHEST/LUNGS:  Clear to auscultation bilaterally.  BREASTS:  Deferred.  HEART:  S1-S2 distinct.  ABDOMEN:  Soft, nontender, bowel sounds present.  PELVIS:  Stable.  GENITOURINARY:  Deferred.  EXTREMITIES:  Left hip has increased pain, decreased range of motion.  SKIN:  No cellulitis.  NEUROLOGIC:  Intact distal  sensibilities.   LABORATORY DATA:  Labs, EKG, chest x-ray all pending presurgical  testing.  No sticks of left arm.   IMPRESSION:  Left hip osteoarthritis.   PLAN OF ACTION:  Left total hip replacement by Dr. Charlann Boxer at [facility]  Nov 06, 2008.  Risks and complications were discussed.   Postoperative medications were provided including Lovenox for DVT  prophylaxis.     ______________________________  Yetta Glassman [address], [place]      Madlyn Frankel. Charlann Boxer, M.D.  Electronically Signed    BLM/MEDQ  D:  10/31/2008  T:  10/31/2008  Job:  [id number]   cc:   William A. Leveda Anna, M.D.   Charlcie Cradle Delford Field, MD, FCCP  [address]  [place]  [place] [address]

## 2010-11-18 NOTE — Op Note (Signed)
Margaret [name], Margaret [name]              ACCOUNT NO.:  [id number]   MEDICAL RECORD NO.:  [id number]          PATIENT TYPE:  AMB   LOCATION:  CARD                         FACILITY:  [facility]   PHYSICIAN:  Charlcie Cradle. Delford Field, MD, FCCPDATE OF BIRTH:  [birth year]   DATE OF PROCEDURE:  10/18/2007  DATE OF DISCHARGE:                               OPERATIVE REPORT   BRONCHOSCOPY OPERATIVE NOTE   CHIEF COMPLAINT:  Evaluate right upper lobe infiltrate, evaluate for  cause.   OPERATOR:  Shan Levans, MD, FCCP.   ANESTHESIA:  Xylocaine local, 1%.   PREOPERATIVE MEDICATION:  1. Fentanyl 30 mcg.  2. Versed 3 mg IV push.   PROCEDURE:  The Pentax video bronchoscope was introduced via the right  nares.  The upper airways were visualized and revealed no endobronchial  lesions.  The entire tracheobronchial tree was visualized and revealed  no airway lesion seen.  No airway inflammation seen, no endobronchial  lesion was seen.  Attention was then paid to the right upper lobe.  Transbronchial biopsies x7 were obtained, bronchial washings were  obtained.  Complications were 25 mL bleeding controlled with topical  epinephrine.   IMPRESSION:  Right upper lobe infiltrate, rule out malignancy versus  chronic inflammation versus bronchiolitis obliterans, organized  pneumonia versus a chronic atypical mycobacterial infection in the  setting of breast cancer with recent diagnosis.   RECOMMENDATIONS:  Follow up pathology and microbiology.      Charlcie Cradle Delford Field, MD, [facility]  Electronically Signed     PEW/MEDQ  D:  10/18/2007  T:  10/18/2007  Job:  [id number]   cc:   Pierce Crane, MD  Fax: [phone]   Titus Dubin. Alwyn Ren, MD,FACP,FCCP  [phone] [place]  [place] [address]

## 2010-11-20 ENCOUNTER — Other Ambulatory Visit: Payer: Self-pay | Admitting: Family Medicine

## 2010-11-20 MED ORDER — ZOLPIDEM TARTRATE 10 MG PO TABS: ORAL_TABLET | ORAL | Status: DC

## 2010-11-20 NOTE — Telephone Encounter (Signed)
Refilled via fax request. 

## 2010-11-21 NOTE — Op Note (Signed)
Margaret [name], Margaret [name]              ACCOUNT NO.:  [id number]   MEDICAL RECORD NO.:  [id number]          PATIENT TYPE:  AMB   LOCATION:  ENDO                         FACILITY:  [facility]   PHYSICIAN:  Anselmo Rod, M.D.  DATE OF BIRTH:  [birth year]   DATE OF PROCEDURE:  03/16/2005  DATE OF DISCHARGE:                                 OPERATIVE REPORT   PROCEDURE:  Screening colonoscopy.   ENDOSCOPIST:  Anselmo Rod, M.D.   INSTRUMENT USED:  Olympus videocolonoscope.   INDICATIONS FOR PROCEDURE:  The patient is a 70 year old white female  undergoing screening colonoscopy to rule out colonic polyps, masses, etc.   PREPROCEDURE PREPARATION:  Informed consent was secured from the patient.  The patient was fasted for 8 hours prior to the procedure and prepped with a  bottle of magnesium citrate and a gallon of GoLYTELY the night prior to the  procedure.  The risks and benefits of the procedure, including a 10% miss  rate of cancer and polyps were discussed with the patient as well.   PREPROCEDURE PHYSICAL EXAMINATION:  VITAL SIGNS:  Stable.  NECK:  Supple.  CHEST:  Clear to auscultation.  HEART:  S1 and S3, regular.  ABDOMEN:  Soft with normal bowel sounds.   DESCRIPTION OF PROCEDURE:  The patient was placed in the left lateral  decubitus position and sedated with 75 mg of Demerol and 7.5 mg of Versed in  slow incremental doses.  Once the patient was adequately sedated and  maintained on low-flow oxygen and continuous cardiac monitoring, the Olympus  videocolonoscope was advanced from the rectum to the cecum.  The appendiceal  orifice and the ileocecal valve were clearly visualized and photographed.  The patient had evidence of sigmoid diverticulosis.  These diverticula were  in the early stages of formation.  No masses or polyps were seen.   IMPRESSION:  1.  Early sigmoid diverticulosis.  2.  No masses or polyps seen.   RECOMMENDATIONS:  1.  Continue high fiber diet with liberal  fluid intake.  Brochures on      diverticulosis have been given to the patient      for education.  2.  Repeat colonoscopy has been recommended in the next 10 years unless the      patient develops any abnormal symptoms in the interim in which case the      patient is to contact the office immediately.      Anselmo Rod, M.D.  Electronically Signed     JNM/MEDQ  D:  03/16/2005  T:  03/16/2005  Job:  [id number]   cc:   Laqueta Linden, M.D.  [address]  [place]  [place] [address]  Fax: [phone]   Santiago Bumpers. Leveda Anna, M.D.  Fax: [phone]

## 2010-11-21 NOTE — Op Note (Signed)
[name], [name]              ACCOUNT NO.:  [id number]   MEDICAL RECORD NO.:  [id number]          PATIENT TYPE:  AMB   LOCATION:  DSC                          FACILITY:  [facility]   PHYSICIAN:  Katy Fitch. 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   POSTOPERATIVE DIAGNOSIS:  Bilateral carpal tunnel syndrome documented by  electrodiagnostic studies.   OPERATIONS:  Release of right transverse carpal ligament and injection of  left ulnar bursa.   OPERATING SURGEON:  Katy Fitch. Sypher, M.D.   ASSISTANT:  Marveen Reeks. Dasnoit, P.A.-C.   ANESTHESIA:  General by LMA.   SUPERVISING ANESTHESIOLOGIST:  Janetta Hora. Gelene Mink, M.D.   INDICATIONS:  Margaret [name] is a 70 year old woman referred by Dr. Doralee Albino of [facility] for evaluation and management of bilateral  hand numbness and discomfort.   Dr. Leveda Anna had evaluated Margaret [name] and noted evidence of probable  bilateral carpal tunnel syndrome.  She was referred to [facility] for evaluation and management and was noted on electrodiagnostic  studies to have evidence of bilateral carpal tunnel syndrome in January  2006.  She was referred for upper extremity orthopedic consult.   After informed consent, she is brought to the operating room at this time  for release of her right transverse carpal ligament and injection of her  left ulnar bursa.   Preoperatively, questions were invited and answered.   She was noted to have no drug allergies.   PROCEDURE:  Margaret [name] was brought to operating room and placed in  supine position upon the operating table.   Following anesthesia consultation by Dr. Gelene Mink, general anesthesia by  LMA was selected as the appropriate choice.   Following the  induction general anesthesia, the right arm was prepped with  Betadine soap and solution and sterilely draped.  A pneumatic tourniquet was  applied to the proximal brachium.   Following exsanguination of the right arm with an Esmarch bandage, an  arterial tourniquet on the proximal brachium was inflated to 220 mmHg.   Procedure commenced with a short incision in the line of the ring finger in  the palm.  Subcutaneous tissues were carefully divided revealing the palmar  fascia.  The fibers of the palmar fascia were split longitudinally revealing  the superficial palmar arch and the common sensory branches of the median  nerve.   The common sensory branches were followed back to the median nerve proper.   The carpal canal was sounded with a Penfield 4 elevator, followed by  identification of the ulnar aspect of the ulnar bursa and clearing a path  for release of the transverse carpal ligament.   The ligament was released with scissors along the ulnar border of the  transverse carpal ligament, extending into the distal forearm.   This widely opened the carpal canal.   No masses or other anatomic predicaments were noted within the canal.  Bleeding was controlled by direct pressure.   The skin was then repaired with intradermal 3-0 Prolene suture.   A compressive dressing was applied with a volar plaster splint, maintaining  the wrist in 5 degrees of dorsiflexion.   The tourniquet was released with immediate capillary refill to the fingers  and thumb.   Attention then directed to the left wrist.   The wrist was prepped with Betadine followed by injection of a mixture of  0.5 mL of Depo-Medrol 40 mg/mL and 1.5 mL of 1% lidocaine without  epinephrine.   The injection was accomplished with the fingers initially placed in full  flexion at needle insertion, followed by full extension during injection of  the ulnar bursa.  Care was taken the follow landmarks to avoid the median   nerve.   This wound was dressed with a Band-Aid.   Margaret [name] was then awakened from her anesthesia and transferred to the  recovery room with stable vital signs.   For aftercare, she will be discharged with a prescription for Darvocet-N 100  one p.o. q.4-6 h. p.r.n. pain, 20 tablets without refill.      RVS/MEDQ  D:  09/12/2004  T:  09/12/2004  Job:  [id number]   cc:   William A. Leveda Anna, M.D.  Fax: [phone]

## 2010-12-25 ENCOUNTER — Other Ambulatory Visit: Payer: Self-pay | Admitting: Physician Assistant

## 2010-12-25 ENCOUNTER — Encounter (HOSPITAL_BASED_OUTPATIENT_CLINIC_OR_DEPARTMENT_OTHER): Payer: Medicare Other | Admitting: Oncology

## 2010-12-25 DIAGNOSIS — C50319 Malignant neoplasm of lower-inner quadrant of unspecified female breast: Secondary | ICD-10-CM

## 2010-12-25 DIAGNOSIS — Z170 Estrogen receptor positive status [ER+]: Secondary | ICD-10-CM

## 2010-12-25 LAB — CBC WITH DIFFERENTIAL/PLATELET
Basophils Absolute: 0 10*3/uL (ref 0.0–0.1)
EOS%: 1.9 % (ref 0.0–7.0)
Eosinophils Absolute: 0.1 10*3/uL (ref 0.0–0.5)
HGB: 13.5 g/dL (ref 11.6–15.9)
LYMPH%: 12.4 % — ABNORMAL LOW (ref 14.0–49.7)
MCH: 31.5 pg (ref 25.1–34.0)
MCV: 92 fL (ref 79.5–101.0)
MONO%: 5 % (ref 0.0–14.0)
NEUT#: 5.4 10*3/uL (ref 1.5–6.5)
Platelets: 249 10*3/uL (ref 145–400)
RBC: 4.27 10*6/uL (ref 3.70–5.45)
RDW: 14 % (ref 11.2–14.5)

## 2010-12-25 LAB — COMPREHENSIVE METABOLIC PANEL
ALT: 23 U/L (ref 0–35)
AST: 18 U/L (ref 0–37)
Albumin: 4.2 g/dL (ref 3.5–5.2)
Calcium: 9.5 mg/dL (ref 8.4–10.5)
Chloride: 103 mEq/L (ref 96–112)
Potassium: 4.4 mEq/L (ref 3.5–5.3)

## 2011-01-09 ENCOUNTER — Encounter (HOSPITAL_BASED_OUTPATIENT_CLINIC_OR_DEPARTMENT_OTHER): Payer: Medicare Other | Admitting: Oncology

## 2011-01-09 DIAGNOSIS — C50319 Malignant neoplasm of lower-inner quadrant of unspecified female breast: Secondary | ICD-10-CM

## 2011-01-09 DIAGNOSIS — Z170 Estrogen receptor positive status [ER+]: Secondary | ICD-10-CM

## 2011-03-11 ENCOUNTER — Encounter: Payer: Self-pay | Admitting: Family Medicine

## 2011-03-11 DIAGNOSIS — C50919 Malignant neoplasm of unspecified site of unspecified female breast: Secondary | ICD-10-CM

## 2011-03-11 NOTE — Progress Notes (Signed)
  Subjective:    Patient ID: Margaret [name], female    DOB: May 07, 1941, 70 y.o.   MRN: [id number]  HPI Normal diagnostic mammogram    Review of Systems     Objective:   Physical Exam        Assessment & Plan:

## 2011-03-31 LAB — DIFFERENTIAL
Basophils Absolute: 0.1
Basophils Relative: 1
Eosinophils Absolute: 0.2
Eosinophils Relative: 3
Monocytes Absolute: 0.7
Monocytes Relative: 8
Neutro Abs: 6.5

## 2011-03-31 LAB — FUNGUS CULTURE W SMEAR: Fungal Smear: NONE SEEN

## 2011-03-31 LAB — COMPREHENSIVE METABOLIC PANEL
ALT: 27
AST: 24
Albumin: 3.7
Alkaline Phosphatase: 94
BUN: 10
Chloride: 103
Potassium: 4.5
Sodium: 139
Total Bilirubin: 0.7
Total Protein: 6.6

## 2011-03-31 LAB — CBC
HCT: 38
Hemoglobin: 13.1
Platelets: 316
RDW: 13.9
WBC: 8.7

## 2011-03-31 LAB — CULTURE, RESPIRATORY W GRAM STAIN

## 2011-03-31 LAB — URINALYSIS, ROUTINE W REFLEX MICROSCOPIC
Bilirubin Urine: NEGATIVE
Hgb urine dipstick: NEGATIVE
Ketones, ur: NEGATIVE
Nitrite: NEGATIVE
Protein, ur: NEGATIVE
Specific Gravity, Urine: 1.013
Urobilinogen, UA: 0.2

## 2011-05-05 ENCOUNTER — Ambulatory Visit (INDEPENDENT_AMBULATORY_CARE_PROVIDER_SITE_OTHER): Payer: Medicare Other | Admitting: Family Medicine

## 2011-05-05 ENCOUNTER — Encounter: Payer: Self-pay | Admitting: Family Medicine

## 2011-05-05 VITALS — BP 175/89 | HR 74 | Temp 97.0°F | Ht 64.0 in | Wt 144.0 lb

## 2011-05-05 DIAGNOSIS — J040 Acute laryngitis: Secondary | ICD-10-CM

## 2011-05-05 DIAGNOSIS — J8410 Pulmonary fibrosis, unspecified: Secondary | ICD-10-CM

## 2011-05-05 DIAGNOSIS — Z23 Encounter for immunization: Secondary | ICD-10-CM

## 2011-05-05 MED ORDER — ALBUTEROL SULFATE HFA 108 (90 BASE) MCG/ACT IN AERS: 2.0000 | INHALATION_SPRAY | RESPIRATORY_TRACT | Status: DC | PRN

## 2011-05-05 MED ORDER — AZITHROMYCIN 500 MG PO TABS: 500.0000 mg | ORAL_TABLET | Freq: Every day | ORAL | Status: AC

## 2011-05-05 NOTE — Assessment & Plan Note (Signed)
Likely secondary to viral URI.   Symptomatic treatment. Azithromycin prescription and albuterol refill are secondary to what sounds like on exam chronic lung disease exacerbation. Please see pulmonary fibrosis above.

## 2011-05-05 NOTE — Assessment & Plan Note (Signed)
She sounds on exam like COPD/asthma exacerbation.   Refill Albuterol and start 3 day course of Azithro. To call if no improvement by this weekend, would recommend short burst of steroids at that time.  She would like to hold off on steroids currently.

## 2011-05-05 NOTE — Progress Notes (Signed)
  Subjective:    Patient ID: Margaret [name], female    DOB: [birth year], 70 y.o.   MRN: [id number]  HPI 1.  Laryngitis:  Patient with general malaise starting this past weekend. Also had a cough which developed at the same time. Starting yesterday she began to lose her voice. She steadily lost her voice today. No real sore throat. No nasal congestion or drainage. No fevers or chills. No sick contacts. Does have history of laryngitis and losing her voice with previous URIs.   Review of Systems See HPI above for review of systems.       Objective:   Physical Exam Gen:  Alert, cooperative patient who appears stated age in no acute distress.  Vital signs reviewed. HEENT:  [place]/AT.  EOMI, PERRL.  nasal turbinates pink, nonedematous. MMM, tonsils non-erythematous, non-edematous.  External ears WNL, Bilateral TM's normal without retraction, redness or bulging.  Neck: No masses or thyromegaly or limitation in range of motion.  No cervical lymphadenopathy. Cardiac:  Regular rate and rhythm without murmur auscultated.  Good S1/S2. Lungs:   Diffuse wheezing in all lung fields. Prolonged expiratory phase.       Assessment & Plan:

## 2011-06-11 ENCOUNTER — Telehealth: Payer: Self-pay | Admitting: Oncology

## 2011-06-11 NOTE — Telephone Encounter (Signed)
per pof 07/06 called pts home lmovm for appts in jan2013

## 2011-06-15 ENCOUNTER — Telehealth: Payer: Self-pay | Admitting: Oncology

## 2011-06-15 NOTE — Telephone Encounter (Signed)
pt rtn call and confirmed appts for jan2013 °

## 2011-06-18 ENCOUNTER — Telehealth: Payer: Self-pay | Admitting: Oncology

## 2011-06-18 NOTE — Telephone Encounter (Signed)
pt called and confirmed appt for jan2013

## 2011-07-09 ENCOUNTER — Other Ambulatory Visit (HOSPITAL_BASED_OUTPATIENT_CLINIC_OR_DEPARTMENT_OTHER): Payer: Medicare Other | Admitting: Lab

## 2011-07-09 ENCOUNTER — Other Ambulatory Visit: Payer: Self-pay

## 2011-07-09 DIAGNOSIS — Z170 Estrogen receptor positive status [ER+]: Secondary | ICD-10-CM

## 2011-07-09 DIAGNOSIS — C50919 Malignant neoplasm of unspecified site of unspecified female breast: Secondary | ICD-10-CM

## 2011-07-09 LAB — CBC WITH DIFFERENTIAL/PLATELET
Basophils Absolute: 0 10*3/uL (ref 0.0–0.1)
Eosinophils Absolute: 0.1 10*3/uL (ref 0.0–0.5)
HGB: 13.6 g/dL (ref 11.6–15.9)
MCV: 92.5 fL (ref 79.5–101.0)
MONO#: 0.4 10*3/uL (ref 0.1–0.9)
MONO%: 6.6 % (ref 0.0–14.0)
NEUT#: 4.9 10*3/uL (ref 1.5–6.5)
Platelets: 276 10*3/uL (ref 145–400)
RDW: 13.6 % (ref 11.2–14.5)
WBC: 6.6 10*3/uL (ref 3.9–10.3)

## 2011-07-10 LAB — COMPREHENSIVE METABOLIC PANEL
Albumin: 4.4 g/dL (ref 3.5–5.2)
Alkaline Phosphatase: 92 U/L (ref 39–117)
BUN: 16 mg/dL (ref 6–23)
CO2: 31 mEq/L (ref 19–32)
Calcium: 9.9 mg/dL (ref 8.4–10.5)
Glucose, Bld: 110 mg/dL — ABNORMAL HIGH (ref 70–99)
Potassium: 4.2 mEq/L (ref 3.5–5.3)
Total Protein: 6.5 g/dL (ref 6.0–8.3)

## 2011-07-10 LAB — LACTATE DEHYDROGENASE: LDH: 197 U/L (ref 94–250)

## 2011-07-10 LAB — VITAMIN D 25 HYDROXY (VIT D DEFICIENCY, FRACTURES): Vit D, 25-Hydroxy: 34 ng/mL (ref 30–89)

## 2011-07-10 LAB — CANCER ANTIGEN 27.29: CA 27.29: 30 U/mL (ref 0–39)

## 2011-07-14 ENCOUNTER — Ambulatory Visit (HOSPITAL_BASED_OUTPATIENT_CLINIC_OR_DEPARTMENT_OTHER): Payer: Medicare Other | Admitting: Oncology

## 2011-07-14 ENCOUNTER — Telehealth: Payer: Self-pay | Admitting: Oncology

## 2011-07-14 VITALS — BP 173/84 | HR 87 | Temp 97.8°F | Ht 64.0 in | Wt 146.1 lb

## 2011-07-14 DIAGNOSIS — C50919 Malignant neoplasm of unspecified site of unspecified female breast: Secondary | ICD-10-CM

## 2011-07-14 MED ORDER — EXEMESTANE 25 MG PO TABS: 25.0000 mg | ORAL_TABLET | Freq: Every day | ORAL | Status: DC

## 2011-07-14 NOTE — Telephone Encounter (Signed)
Gv pt appt for [id number].  scheduled bone density for 11/12/2011 @ [facility].  called [facility] to scheduled mammogram and was told that pt was scheduled for 03/10/2012

## 2011-07-14 NOTE — Progress Notes (Signed)
Hematology and Oncology Follow Up Visit  Margaret [name] [id number] 1940/08/28 71 y.o. 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  Interim History:  There have been no intercurrent illness, hospitalizations or medication changes.  Medications: I have reviewed the patient's current medications.  Allergies: No Known Allergies  Past Medical History, Surgical history, Social history, and Family History were reviewed and updated.  Review of Systems: Constitutional:  Negative for fever, chills, night sweats, anorexia, weight loss, pain. Cardiovascular: no chest pain or dyspnea on exertion Respiratory: no cough, shortness of breath, or wheezing Neurological: negative Dermatological: negative ENT: negative Skin Gastrointestinal: no abdominal pain, change in bowel habits, or black or bloody stools Genito-Urinary: no dysuria, trouble voiding, or hematuria Hematological and Lymphatic: negative Breast: negative for breast lumps Musculoskeletal: negative Remaining ROS negative.  Physical Exam: Blood pressure 173/84, pulse 87, temperature 97.8 F (36.6 C), height 5\' 4"  (1.626 m), weight 146 lb 1.6 oz (66.271 kg). ECOG: 0 General appearance: alert, cooperative and appears stated age Head: Normocephalic, without obvious abnormality, atraumatic Neck: no adenopathy, no carotid bruit, no JVD, supple, symmetrical, trachea midline and thyroid not enlarged, symmetric, no tenderness/mass/nodules Lymph nodes: Cervical, supraclavicular, and axillary nodes normal. Cardiac : regular rate and rhythm, no murmurs or gallops Pulmonary:clear to auscultation bilaterally and normal percussion bilaterally Breasts: inspection negative, no nipple discharge or bleeding, no masses or nodularity palpable Abdomen:soft, non-tender; bowel  sounds normal; no masses,  no organomegaly Extremities negative Neuro: alert, oriented, normal speech, no focal findings or movement disorder noted  Lab Results: Lab Results  Component Value Date   WBC 6.6 07/09/2011   HGB 13.6 07/09/2011   HCT 39.9 07/09/2011   MCV 92.5 07/09/2011   PLT 276 07/09/2011     Chemistry      Component Value Date/Time   NA 141 07/09/2011 1343   NA 141 07/09/2011 1343   NA 141 07/09/2011 1343   NA 141 07/09/2011 1343   K 4.2 07/09/2011 1343   K 4.2 07/09/2011 1343   K 4.2 07/09/2011 1343   K 4.2 07/09/2011 1343   CL 100 07/09/2011 1343   CL 100 07/09/2011 1343   CL 100 07/09/2011 1343   CL 100 07/09/2011 1343   CO2 31 07/09/2011 1343   CO2 31 07/09/2011 1343   CO2 31 07/09/2011 1343   CO2 31 07/09/2011 1343   BUN 16 07/09/2011 1343   BUN 16 07/09/2011 1343   BUN 16 07/09/2011 1343   BUN 16 07/09/2011 1343   CREATININE 0.81 07/09/2011 1343   CREATININE 0.81 07/09/2011 1343   CREATININE 0.81 07/09/2011 1343   CREATININE 0.81 07/09/2011 1343      Component Value Date/Time   CALCIUM 9.9 07/09/2011 1343   CALCIUM 9.9 07/09/2011 1343   CALCIUM 9.9 07/09/2011 1343   CALCIUM 9.9 07/09/2011 1343   ALKPHOS 92 07/09/2011 1343   ALKPHOS 92 07/09/2011 1343   ALKPHOS 92 07/09/2011 1343   ALKPHOS 92 07/09/2011 1343   AST 36 07/09/2011 1343   AST 36 07/09/2011 1343   AST 36 07/09/2011 1343   AST 36 07/09/2011 1343   ALT 43* 07/09/2011 1343   ALT 43* 07/09/2011 1343   ALT 43* 07/09/2011 1343   ALT 43* 07/09/2011 1343   BILITOT 0.5 07/09/2011 1343   BILITOT 0.5 07/09/2011 1343  BILITOT 0.5 07/09/2011 1343   BILITOT 0.5 07/09/2011 1343      .pathology. Radiological Studies: chest X-ray n/a Mammogram n/a Bone density N/a due 5/13  Impression and Plan: Ms fergusin is doing well, I will see he rin 6 months. She will have her mamm and BDin the spring and will increase her vit d intake to 2000 u/d.   More than 50% of the visit was spent in patient-related counselling   Pierce Crane, MD 1/8/201312:25 PM

## 2011-09-08 ENCOUNTER — Other Ambulatory Visit: Payer: Self-pay | Admitting: *Deleted

## 2011-09-08 DIAGNOSIS — C50319 Malignant neoplasm of lower-inner quadrant of unspecified female breast: Secondary | ICD-10-CM

## 2011-09-08 MED ORDER — VENLAFAXINE HCL ER 37.5 MG PO CP24: 37.5000 mg | ORAL_CAPSULE | Freq: Every day | ORAL | Status: DC

## 2011-10-29 ENCOUNTER — Encounter: Payer: Self-pay | Admitting: Home Health Services

## 2011-11-23 ENCOUNTER — Ambulatory Visit
Admission: RE | Admit: 2011-11-23 | Discharge: 2011-11-23 | Disposition: A | Discharge status: Home / Self Care | Payer: Medicare Other | Source: Ambulatory Visit | Attending: Oncology | Admitting: Oncology

## 2011-11-23 DIAGNOSIS — C50919 Malignant neoplasm of unspecified site of unspecified female breast: Secondary | ICD-10-CM

## 2011-12-11 ENCOUNTER — Encounter: Payer: Self-pay | Admitting: Oncology

## 2012-01-12 ENCOUNTER — Other Ambulatory Visit (HOSPITAL_BASED_OUTPATIENT_CLINIC_OR_DEPARTMENT_OTHER): Payer: Medicare Other

## 2012-01-12 ENCOUNTER — Other Ambulatory Visit: Payer: Self-pay | Admitting: Oncology

## 2012-01-12 ENCOUNTER — Other Ambulatory Visit: Payer: Self-pay | Admitting: Physician Assistant

## 2012-01-12 DIAGNOSIS — C50919 Malignant neoplasm of unspecified site of unspecified female breast: Secondary | ICD-10-CM

## 2012-01-12 DIAGNOSIS — E559 Vitamin D deficiency, unspecified: Secondary | ICD-10-CM

## 2012-01-12 LAB — CBC WITH DIFFERENTIAL/PLATELET
BASO%: 0.8 % (ref 0.0–2.0)
EOS%: 2.4 % (ref 0.0–7.0)
MCH: 31.5 pg (ref 25.1–34.0)
MCHC: 33.6 g/dL (ref 31.5–36.0)
MCV: 93.8 fL (ref 79.5–101.0)
MONO%: 8.3 % (ref 0.0–14.0)
RBC: 4.18 10*6/uL (ref 3.70–5.45)
RDW: 13.8 % (ref 11.2–14.5)
lymph#: 0.9 10*3/uL (ref 0.9–3.3)

## 2012-01-13 LAB — COMPREHENSIVE METABOLIC PANEL
ALT: 27 U/L (ref 0–35)
AST: 24 U/L (ref 0–37)
Albumin: 4 g/dL (ref 3.5–5.2)
Alkaline Phosphatase: 78 U/L (ref 39–117)
BUN: 15 mg/dL (ref 6–23)
Calcium: 9.1 mg/dL (ref 8.4–10.5)
Chloride: 104 mEq/L (ref 96–112)
Potassium: 4.5 mEq/L (ref 3.5–5.3)

## 2012-01-13 LAB — VITAMIN D 25 HYDROXY (VIT D DEFICIENCY, FRACTURES): Vit D, 25-Hydroxy: 65 ng/mL (ref 30–89)

## 2012-01-19 ENCOUNTER — Telehealth: Payer: Self-pay | Admitting: *Deleted

## 2012-01-19 ENCOUNTER — Ambulatory Visit (HOSPITAL_BASED_OUTPATIENT_CLINIC_OR_DEPARTMENT_OTHER): Payer: Medicare Other | Admitting: Oncology

## 2012-01-19 VITALS — BP 143/71 | HR 91 | Temp 98.3°F | Ht 64.0 in | Wt 152.1 lb

## 2012-01-19 DIAGNOSIS — C50919 Malignant neoplasm of unspecified site of unspecified female breast: Secondary | ICD-10-CM

## 2012-01-19 DIAGNOSIS — E559 Vitamin D deficiency, unspecified: Secondary | ICD-10-CM

## 2012-01-19 DIAGNOSIS — C50319 Malignant neoplasm of lower-inner quadrant of unspecified female breast: Secondary | ICD-10-CM

## 2012-01-19 MED ORDER — EXEMESTANE 25 MG PO TABS: 25.0000 mg | ORAL_TABLET | Freq: Every day | ORAL | Status: DC

## 2012-01-19 MED ORDER — VENLAFAXINE HCL ER 37.5 MG PO CP24: 37.5000 mg | ORAL_CAPSULE | Freq: Every day | ORAL | Status: DC

## 2012-01-19 NOTE — Progress Notes (Signed)
Hematology and Oncology Follow Up Visit  Margaret [name] [id number] 1941-05-25 71 y.o. 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  Interim History:  There have been no intercurrent illness, hospitalizations or medication changes. She's feeling great she tolerates Aromasin well. 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 She is planning a trip to next week. [facility]  Medications: I have reviewed the patient's current medications.  Allergies: No Known Allergies  Past Medical History, Surgical history, Social history, and Family History were reviewed and updated.  Review of Systems: Constitutional:  Negative for fever, chills, night sweats, anorexia, weight loss, pain. Cardiovascular: no chest pain or dyspnea on exertion Respiratory: no cough, shortness of breath, or wheezing Neurological: negative Dermatological: negative ENT: negative Skin Gastrointestinal: no abdominal pain, change in bowel habits, or black or bloody stools Genito-Urinary: no dysuria, trouble voiding, or hematuria Hematological and Lymphatic: negative Breast: negative for breast lumps Musculoskeletal: negative Remaining ROS negative.  Physical Exam: Blood pressure 143/71, pulse 91, temperature 98.3 F (36.8 C), height 5\' 4"  (1.626 m), weight 152 lb 1.6 oz (68.992 kg). ECOG: 0 General appearance: alert, cooperative and appears stated age Head: Normocephalic, without obvious abnormality, atraumatic Neck: no adenopathy, no carotid bruit, no JVD, supple, symmetrical, trachea midline and thyroid not enlarged, symmetric, no tenderness/mass/nodules Lymph nodes: Cervical, supraclavicular, and axillary nodes normal. Cardiac : regular rate and rhythm, no murmurs or gallops Pulmonary:clear to  auscultation bilaterally and normal percussion bilaterally Breasts: inspection negative, no nipple discharge or bleeding, no masses or nodularity palpable, slight defect left breast stable no new masses. Abdomen:soft, non-tender; bowel sounds normal; no masses,  no organomegaly Extremities negative Neuro: alert, oriented, normal speech, no focal findings or movement disorder noted  Lab Results: Lab Results  Component Value Date   WBC 5.6 01/12/2012   HGB 13.2 01/12/2012   HCT 39.2 01/12/2012   MCV 93.8 01/12/2012   PLT 246 01/12/2012     Chemistry      Component Value Date/Time   NA 140 01/12/2012 1023   K 4.5 01/12/2012 1023   CL 104 01/12/2012 1023   CO2 26 01/12/2012 1023   BUN 15 01/12/2012 1023   CREATININE 0.59 01/12/2012 1023      Component Value Date/Time   CALCIUM 9.1 01/12/2012 1023   ALKPHOS 78 01/12/2012 1023   AST 24 01/12/2012 1023   ALT 27 01/12/2012 1023   BILITOT 0.6 01/12/2012 1023      .pathology. Radiological Studies: chest X-ray n/a Mammogram n/a Bone density Bone density spine, T score -1.5 and hip stable  Impression and Plan:  Patient is doing well. No evidence of recurrence. We discussed continuation of Aromasin and I will see her in 6 months. I also discussed the likelihood of continuing Aromasin until year 10 following surgery. I discussed the rationale for that. I reviewed her bone density results, mammogram and recent labs show a normal vitamin D level. We also reviewed her score on the menopause r relief scale as well. I reordered Aromasin and her Effexor as well. We reviewed her BMI today as well as was excellent 26.2.  More than 50% of the visit was spent in patient-related counselling   Pierce Crane, MD 7/16/20139:14 AM

## 2012-01-19 NOTE — Patient Instructions (Signed)
pls continue same medication, f/u in 6  Months.

## 2012-01-19 NOTE — Telephone Encounter (Signed)
Made patient appointment for 07-2012 with the labs being one week before the md appointment printed out calendar and gave to the patient

## 2012-02-26 ENCOUNTER — Telehealth: Payer: Self-pay | Admitting: Family Medicine

## 2012-02-26 NOTE — Telephone Encounter (Signed)
Left vm for pt to return call, pt is eligible for free 30 min f/u appt with Rosalita Chessman, letter was sent to pt.

## 2012-04-13 ENCOUNTER — Other Ambulatory Visit: Payer: Self-pay | Admitting: Dermatology

## 2012-04-19 ENCOUNTER — Encounter: Payer: Self-pay | Admitting: Family Medicine

## 2012-04-19 DIAGNOSIS — C4492 Squamous cell carcinoma of skin, unspecified: Secondary | ICD-10-CM | POA: Insufficient documentation

## 2012-06-03 ENCOUNTER — Telehealth: Payer: Self-pay | Admitting: *Deleted

## 2012-06-03 NOTE — Telephone Encounter (Signed)
Mailed out calendar to inform the patient of the new date and time 

## 2012-07-15 ENCOUNTER — Other Ambulatory Visit (HOSPITAL_BASED_OUTPATIENT_CLINIC_OR_DEPARTMENT_OTHER): Payer: Medicare Other | Admitting: Lab

## 2012-07-15 DIAGNOSIS — E559 Vitamin D deficiency, unspecified: Secondary | ICD-10-CM

## 2012-07-15 DIAGNOSIS — C50919 Malignant neoplasm of unspecified site of unspecified female breast: Secondary | ICD-10-CM

## 2012-07-15 LAB — COMPREHENSIVE METABOLIC PANEL (CC13)
AST: 20 U/L (ref 5–34)
Albumin: 3.5 g/dL (ref 3.5–5.0)
BUN: 14 mg/dL (ref 7.0–26.0)
CO2: 29 mEq/L (ref 22–29)
Calcium: 9.6 mg/dL (ref 8.4–10.4)
Chloride: 103 mEq/L (ref 98–107)
Glucose: 112 mg/dl — ABNORMAL HIGH (ref 70–99)
Potassium: 4.3 mEq/L (ref 3.5–5.1)

## 2012-07-15 LAB — CBC WITH DIFFERENTIAL/PLATELET
Basophils Absolute: 0.1 10*3/uL (ref 0.0–0.1)
HCT: 39.6 % (ref 34.8–46.6)
HGB: 13.1 g/dL (ref 11.6–15.9)
LYMPH%: 9 % — ABNORMAL LOW (ref 14.0–49.7)
MCH: 30.4 pg (ref 25.1–34.0)
MCHC: 33.1 g/dL (ref 31.5–36.0)
MONO#: 0.5 10*3/uL (ref 0.1–0.9)
NEUT%: 79.5 % — ABNORMAL HIGH (ref 38.4–76.8)
Platelets: 231 10*3/uL (ref 145–400)
WBC: 7 10*3/uL (ref 3.9–10.3)
lymph#: 0.6 10*3/uL — ABNORMAL LOW (ref 0.9–3.3)

## 2012-07-22 ENCOUNTER — Ambulatory Visit: Payer: Medicare Other | Admitting: Oncology

## 2012-07-27 ENCOUNTER — Other Ambulatory Visit: Payer: Self-pay | Admitting: *Deleted

## 2012-07-27 DIAGNOSIS — E559 Vitamin D deficiency, unspecified: Secondary | ICD-10-CM

## 2012-07-27 DIAGNOSIS — C50919 Malignant neoplasm of unspecified site of unspecified female breast: Secondary | ICD-10-CM

## 2012-07-27 DIAGNOSIS — C50319 Malignant neoplasm of lower-inner quadrant of unspecified female breast: Secondary | ICD-10-CM

## 2012-07-27 MED ORDER — EXEMESTANE 25 MG PO TABS: 25.0000 mg | ORAL_TABLET | Freq: Every day | ORAL | Status: DC

## 2012-07-27 MED ORDER — VENLAFAXINE HCL ER 37.5 MG PO CP24: 37.5000 mg | ORAL_CAPSULE | Freq: Every day | ORAL | Status: DC

## 2012-08-03 ENCOUNTER — Encounter: Payer: Self-pay | Admitting: Oncology

## 2012-08-03 ENCOUNTER — Encounter: Payer: Self-pay | Admitting: *Deleted

## 2012-08-03 NOTE — Progress Notes (Unsigned)
Called and spoke with patient concerning her follow up appt. Confirmed appt. With Zollie Scale, PA on 08/12/12 at 115.

## 2012-08-12 ENCOUNTER — Ambulatory Visit (HOSPITAL_BASED_OUTPATIENT_CLINIC_OR_DEPARTMENT_OTHER): Payer: Medicare Other | Admitting: Physician Assistant

## 2012-08-12 ENCOUNTER — Ambulatory Visit: Payer: Medicare Other | Admitting: Oncology

## 2012-08-12 ENCOUNTER — Telehealth: Payer: Self-pay | Admitting: Oncology

## 2012-08-12 ENCOUNTER — Encounter: Payer: Self-pay | Admitting: Physician Assistant

## 2012-08-12 VITALS — BP 160/72 | HR 98 | Temp 98.3°F | Resp 20 | Ht 64.0 in | Wt 152.4 lb

## 2012-08-12 DIAGNOSIS — C50919 Malignant neoplasm of unspecified site of unspecified female breast: Secondary | ICD-10-CM

## 2012-08-12 DIAGNOSIS — C50319 Malignant neoplasm of lower-inner quadrant of unspecified female breast: Secondary | ICD-10-CM

## 2012-08-12 DIAGNOSIS — Z170 Estrogen receptor positive status [ER+]: Secondary | ICD-10-CM

## 2012-08-12 DIAGNOSIS — E559 Vitamin D deficiency, unspecified: Secondary | ICD-10-CM

## 2012-08-12 DIAGNOSIS — M8580 Other specified disorders of bone density and structure, unspecified site: Secondary | ICD-10-CM

## 2012-08-12 MED ORDER — VENLAFAXINE HCL ER 37.5 MG PO CP24: 37.5000 mg | ORAL_CAPSULE | Freq: Every day | ORAL | Status: DC

## 2012-08-12 MED ORDER — EXEMESTANE 25 MG PO TABS: 25.0000 mg | ORAL_TABLET | Freq: Every day | ORAL | Status: DC

## 2012-08-12 NOTE — Progress Notes (Signed)
ID: Margaret [name]   DOB: 03-17-1941  MR#: [id number]  CSN#:[id number]  PCP: Sanjuana Letters, MD GYN:  [name], MD SU:  Cyndia Bent, MD OTHER MD:  Antony Blackbird, MD   HISTORY OF PRESENT ILLNESS: Margaret [name] was seen by Dr. Hyacinth Meeker who referred her for mammogram because of a palpable left breast mass on 08/24/2007.  A mammogram plus ultrasound showed a palpable mass on the left at 6 o'clock posteriorly 1.3 cm, which was spiculated and multilobulated.  There was a 1.6 cm abnormal appearing lymph node in the left axilla.  Biopsy was recommended.    A biopsy on 08/30/2007 (case number [id number]) of the breast mass as well as lymph node showed invasive ductal cancer, intermediate grade.  This was ER and PR positive, 100%, MIB-1 of 8%, and HER-2 was 2+, FISH ratio was 2.18, technically equivocal.  A subsequent BSGI scan on 08/30/2007 showed a 1.2 cm mass in the left breast with an associated area of activity 1.6 cm in the axilla.    [name] is status post left lumpectomy on 10/10/2007 under the care of Dr. 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  One of 11 lymph nodes were positive, with angiolymphatic invasion present. The deep margin was also focally positive for DCIS, and after review with radiation oncology, it was decided that additional surgery was not necessary. Oncotype DX showed a recurrence score of 6 with an average rate of distant recurrence of 5% if treated with tamoxifen for 5 years.  Patient received 6 cycles of adjuvant docetaxel/carboplatin/trastuzumab, then continue trastuzumab to complete one year, with last dose on 10/23/2008. She also underwent radiation therapy under the care of Dr. Roselind Messier, completed late October 2009. She's been on multiple anti-estrogens, but has been on exemestane since January 2010, continuing with good tolerance.   INTERVAL HISTORY: [name] returns today for routine six-month followup of her  left breast carcinoma. She was previously a patient of Dr. Donnie Coffin, now transferring to Dr. Darnelle Catalan surface. She continues on the exemestane which she is tolerating very well. She has no significant hot flashes. She's had no joint pain associated with the eczema stain, and she denies any excessive vaginal dryness.  [name] herself has been very busy. Her husband passed away in 2010-11-26 and she retired from teaching soon thereafter. 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 She also helps to care for her daughter who is bipolar, and this causes [name] a lot of anxiety. She does find that the Effexor is helpful.   REVIEW OF SYSTEMS: Ileta denies any recent illnesses and has had no fevers or chills. She's had no skin changes or signs of abnormal bleeding. She's eating and drinking well with no nausea or change in bowel or bladder habits. No cough, shortness of breath, chest pain, or palpitations. She also denies any abnormal headaches, dizziness, change in vision, myalgias, arthralgias, or any pain, or peripheral edema.  A detailed review of systems is otherwise noncontributory.   PAST MEDICAL HISTORY: Past Medical History  Diagnosis Date  . Cancer 2007-11-26    Breast  . Arthritis     PAST SURGICAL HISTORY: Past Surgical History  Procedure Date  . Abdominal hysterectomy   . Carpal tunnel release     Right hand  . Total hip arthroplasty     Left    FAMILY HISTORY Family History  Problem Relation Age of Onset  .  Diabetes Mother   . Heart disease Father   Parents both deceased.  Two sisters alive and well, one brother alive and well.  No history of breast or ovarian cancer in the family.  GYNECOLOGIC HISTORY: She is gravida 3, para 2, menarche age 55, menopause in early 32s, hormone replacement therapy for 15 years.   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 She was married for 20 years  to [name] who passed away in 11/17/2010 with renal failure. She has one daughter who lives in [place] and is bipolar. She has one son who resides in [place].    ADVANCED DIRECTIVES:  Not in place  HEALTH MAINTENANCE: History  Substance Use Topics  . Smoking status: Never Smoker   . Smokeless tobacco: Never Used  . Alcohol Use: 1.5 oz/week    3 drink(s) per week     Colonoscopy:  approx 2009-11-16  PAP:  UTD, Dr. [name]  Bone density:  May 14, 2013osteopenia  Lipid panel:  UTD, Dr. [name]   No Known Allergies  Current Outpatient Prescriptions  Medication Sig Dispense Refill  . atorvastatin (LIPITOR) 20 MG tablet       . calcium-vitamin D 250-100 MG-UNIT per tablet Take 1 tablet by mouth 2 (two) times daily.        [name] darifenacin (ENABLEX) 7.5 MG 24 hr tablet Take 7.5 mg by mouth daily.        [name] exemestane (AROMASIN) 25 MG tablet Take 1 tablet (25 mg total) by mouth daily.  90 tablet  3  . Multiple Vitamins-Minerals (MULTIVITAMIN WITH MINERALS) tablet Take 1 tablet by mouth daily.        [name] omeprazole (PRILOSEC) 20 MG capsule Take 20 mg by mouth daily.        [name] SYNTHROID 100 MCG tablet TAKE ONE TABLET EVERY DAY  90 tablet  3  . venlafaxine XR (EFFEXOR-XR) 37.5 MG 24 hr capsule Take 1 capsule (37.5 mg total) by mouth daily.  90 capsule  3  . zolpidem (AMBIEN) 10 MG tablet Take 1/2 tablet by mouth at bedtime and a second  1/2 tablet 1-2  hours later if needed.  30 tablet  5  . ALPRAZolam (XANAX) 0.25 MG tablet Take 0.25 mg by mouth as needed.          OBJECTIVE: Filed Vitals:   08/12/12 1315  BP: 160/72  Pulse: 98  Temp: 98.3 F (36.8 C)  Resp: 20     Body mass index is 26.16 kg/(m^2).    ECOG FS: 0 Filed Weights   08/12/12 1315  Weight: 152 lb 6.4 oz (69.128 kg)   Elderly white female who appears very comfortable and in no acute distress.  Sclerae unicteric Oropharynx clear No cervical or supraclavicular adenopathy Lungs clear to auscultation with no  rales or rhonchi Heart regular rate and rhythm Abdomen soft, nontender, positive bowel sounds MSK no focal spinal tenderness, no peripheral edema Neuro: nonfocal, well oriented with positive affect Breasts: Right breast is unremarkable. Left breast is status post lumpectomy with no evidence of local recurrence. Axillae are benign bilaterally with no palpable adenopathy.   LAB RESULTS: Lab Results  Component Value Date   WBC 7.0 07/15/2012   NEUTROABS 5.6 07/15/2012   HGB 13.1 07/15/2012   HCT 39.6 07/15/2012   MCV 91.9 07/15/2012   PLT 231 07/15/2012      Chemistry      Component Value Date/Time   NA  140 07/15/2012 0913   NA 140 01/12/2012 1023   K 4.3 07/15/2012 0913   K 4.5 01/12/2012 1023   CL 103 07/15/2012 0913   CL 104 01/12/2012 1023   CO2 29 07/15/2012 0913   CO2 26 01/12/2012 1023   BUN 14.0 07/15/2012 0913   BUN 15 01/12/2012 1023   CREATININE 0.7 07/15/2012 0913   CREATININE 0.59 01/12/2012 1023      Component Value Date/Time   CALCIUM 9.6 07/15/2012 0913   CALCIUM 9.1 01/12/2012 1023   ALKPHOS 112 07/15/2012 0913   ALKPHOS 78 01/12/2012 1023   AST 20 07/15/2012 0913   AST 24 01/12/2012 1023   ALT 33 07/15/2012 0913   ALT 27 01/12/2012 1023   BILITOT 0.58 07/15/2012 0913   BILITOT 0.6 01/12/2012 1023       Lab Results  Component Value Date   LABCA2 37 01/12/2012     STUDIES:  This recent bilateral mammogram on 03/14/2012 at [facility] was unremarkable.  A bone density on 11/23/2011 showed osteopenia.    ASSESSMENT: 72 y.o.  [place] woman  (1)  status post left lumpectomy under the care of Dr. 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 The invasive ductal carcinoma was strongly ER and PR positive, both at 100%, HER-2/neu positive at 2+ by HercepTest, with MIB-1 of 8%. 1 of 11 lymph nodes were positive, with angiolymphatic invasion present. The deep margin was also focally positive for DCIS.  Oncotype DX showed a  recurrence score of 6 with an average rate of distant recurrence of 5% if treated with tamoxifen for 5 years.  (2)  treated adjuvantly with 6 cycles of docetaxel/carboplatin/trastuzumab given between 10/31/2007 and 02/13/2008. Trastuzumab was then continued for total of one year, last dose given on 10/23/2008  (3) status post radiation therapy under the care of Dr. Roselind Messier did, last treatment on 05/02/2008. It was determined that no additional surgery was necessary for the positive surgical margin.  (4) patient was briefly on letrozole in late 2009, discontinued due to poor tolerance.  (5) patient was on anastrozole for approximately 2-3 weeks in late December 2009, also discontinued due to joint pain and intolerance.  (6) started on exemestane in January 2010 and continues with good tolerance.    PLAN: Shayona is doing very well with regards to her breast cancer. She will continue on the exemestane which I have refilled today, and she will also continue on venlafaxine for hot flashes.  She'll complete 5 years of antiestrogen therapy in late 2014/early 2015, and that point we will discuss whether or not to discontinue therapy, or continue on for total of 10 years which she had discussed briefly with Dr. Donnie Coffin.  Otherwise, Mella will return in 6 months for routine followup. She voices understanding and agreement with this plan, and knows to call with any changes or problems.  Trinten Boudoin    08/12/2012

## 2012-08-12 NOTE — Telephone Encounter (Signed)
gv and printed appt schedule for pt for Aug °

## 2012-09-02 ENCOUNTER — Encounter (HOSPITAL_COMMUNITY): Payer: Self-pay | Admitting: *Deleted

## 2012-09-02 ENCOUNTER — Emergency Department (HOSPITAL_COMMUNITY): Payer: Medicare Other

## 2012-09-02 ENCOUNTER — Emergency Department (HOSPITAL_COMMUNITY)
Admission: EM | Admit: 2012-09-02 | Discharge: 2012-09-02 | Disposition: A | Discharge status: Home / Self Care | Payer: Medicare Other | Attending: Emergency Medicine | Admitting: Emergency Medicine

## 2012-09-02 DIAGNOSIS — S79929A Unspecified injury of unspecified thigh, initial encounter: Secondary | ICD-10-CM | POA: Insufficient documentation

## 2012-09-02 DIAGNOSIS — S0003XA Contusion of scalp, initial encounter: Secondary | ICD-10-CM | POA: Insufficient documentation

## 2012-09-02 DIAGNOSIS — S298XXA Other specified injuries of thorax, initial encounter: Secondary | ICD-10-CM | POA: Insufficient documentation

## 2012-09-02 DIAGNOSIS — Z853 Personal history of malignant neoplasm of breast: Secondary | ICD-10-CM | POA: Insufficient documentation

## 2012-09-02 DIAGNOSIS — S1093XA Contusion of unspecified part of neck, initial encounter: Secondary | ICD-10-CM | POA: Insufficient documentation

## 2012-09-02 DIAGNOSIS — Y9389 Activity, other specified: Secondary | ICD-10-CM | POA: Insufficient documentation

## 2012-09-02 DIAGNOSIS — Z79899 Other long term (current) drug therapy: Secondary | ICD-10-CM | POA: Insufficient documentation

## 2012-09-02 DIAGNOSIS — S79919A Unspecified injury of unspecified hip, initial encounter: Secondary | ICD-10-CM | POA: Insufficient documentation

## 2012-09-02 DIAGNOSIS — Z8739 Personal history of other diseases of the musculoskeletal system and connective tissue: Secondary | ICD-10-CM | POA: Insufficient documentation

## 2012-09-02 DIAGNOSIS — M7918 Myalgia, other site: Secondary | ICD-10-CM

## 2012-09-02 DIAGNOSIS — Y92410 Unspecified street and highway as the place of occurrence of the external cause: Secondary | ICD-10-CM | POA: Insufficient documentation

## 2012-09-02 LAB — POCT I-STAT, CHEM 8
HCT: 45 % (ref 36.0–46.0)
Hemoglobin: 15.3 g/dL — ABNORMAL HIGH (ref 12.0–15.0)
Potassium: 3.5 mEq/L (ref 3.5–5.1)
Sodium: 141 mEq/L (ref 135–145)
TCO2: 29 mmol/L (ref 0–100)

## 2012-09-02 LAB — URINALYSIS, ROUTINE W REFLEX MICROSCOPIC
Glucose, UA: NEGATIVE mg/dL
Leukocytes, UA: NEGATIVE
Nitrite: NEGATIVE
Specific Gravity, Urine: 1.01 (ref 1.005–1.030)
pH: 7.5 (ref 5.0–8.0)

## 2012-09-02 LAB — URINE MICROSCOPIC-ADD ON

## 2012-09-02 MED ORDER — MORPHINE SULFATE 4 MG/ML IJ SOLN
4.0000 mg | Freq: Once | INTRAMUSCULAR | Status: AC
  Administered 2012-09-02: 4 mg via INTRAMUSCULAR
  Filled 2012-09-02: qty 1

## 2012-09-02 MED ORDER — OXYCODONE-ACETAMINOPHEN 5-325 MG PO TABS: ORAL_TABLET | ORAL | Status: DC

## 2012-09-02 MED ORDER — CYCLOBENZAPRINE HCL 10 MG PO TABS: 10.0000 mg | ORAL_TABLET | Freq: Two times a day (BID) | ORAL | Status: DC | PRN
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
## 2012-09-02 NOTE — ED Notes (Signed)
Pt ambulatory to br without difficulty. Pa aware.

## 2012-09-02 NOTE — ED Notes (Signed)
Pt dc to home. Pt states understanding to dc instructions. Pt ambulatory to exit without difficulty. 
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
## 2012-09-02 NOTE — ED Notes (Signed)
Pt up to br with no difficulty.

## 2012-09-02 NOTE — ED Notes (Signed)
Pt remains in radiology 

## 2012-09-04 NOTE — ED Provider Notes (Signed)
Medical screening examination/treatment/procedure(s) were performed by non-physician practitioner and as supervising physician I was immediately available for consultation/collaboration.    Aminata Buffalo L Azarion Hove, MD 09/04/12 1512 

## 2012-09-06 ENCOUNTER — Encounter: Payer: Self-pay | Admitting: Family Medicine

## 2012-09-06 ENCOUNTER — Ambulatory Visit (INDEPENDENT_AMBULATORY_CARE_PROVIDER_SITE_OTHER): Payer: Self-pay | Admitting: Family Medicine

## 2012-09-06 ENCOUNTER — Ambulatory Visit: Payer: Medicare Other | Admitting: Family Medicine

## 2012-09-06 VITALS — BP 156/82 | HR 89 | Temp 97.9°F | Ht 64.0 in | Wt 154.0 lb

## 2012-09-06 DIAGNOSIS — G4700 Insomnia, unspecified: Secondary | ICD-10-CM

## 2012-09-06 DIAGNOSIS — R079 Chest pain, unspecified: Secondary | ICD-10-CM

## 2012-09-06 DIAGNOSIS — G4733 Obstructive sleep apnea (adult) (pediatric): Secondary | ICD-10-CM | POA: Insufficient documentation

## 2012-09-06 MED ORDER — OXYCODONE-ACETAMINOPHEN 5-325 MG PO TABS: ORAL_TABLET | ORAL | Status: DC
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
## 2012-09-06 NOTE — Patient Instructions (Addendum)
Call if you develop fever, wheezing, or shortness of breath.   Try to take deep breaths when the pain is relieved.   I hope you feel better soon.

## 2012-09-06 NOTE — Assessment & Plan Note (Signed)
Neck and back improving
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
## 2012-09-07 ENCOUNTER — Telehealth: Payer: Self-pay | Admitting: Family Medicine

## 2012-09-07 NOTE — Telephone Encounter (Signed)
I called the patient who is feeling a little better taking 2 of the generic percocets q 3 hour. Now able to take deeper breaths and not short of breath. She'll let us know if she fails to continue improving.

## 2012-09-26 ENCOUNTER — Telehealth: Payer: Self-pay | Admitting: Obstetrics & Gynecology

## 2012-09-26 NOTE — Telephone Encounter (Signed)
Spoke with pt about Ambien. Pt only has one refill left and will see SM in July. Pt takes med every night and says Dr Hyacinth Meeker is aware of that. OK to refill until July?

## 2012-09-26 NOTE — Telephone Encounter (Signed)
Ok to refill Ambien 10mg  1/2 to 1 tab po qhs prn insomia.  #30/5RF.

## 2012-09-27 ENCOUNTER — Other Ambulatory Visit: Payer: Self-pay | Admitting: Orthopedic Surgery

## 2012-09-27 DIAGNOSIS — G4700 Insomnia, unspecified: Secondary | ICD-10-CM

## 2012-09-27 MED ORDER — ZOLPIDEM TARTRATE 10 MG PO TABS: 5.0000 mg | ORAL_TABLET | Freq: Every evening | ORAL | Status: DC | PRN

## 2012-09-27 NOTE — Telephone Encounter (Addendum)
Call to [facility] for Ambien 10 mg. Take 5-10 mg PO QHS prn sleep. Disp 30/ 5 RF. 09-27-12 940 aa

## 2012-09-27 NOTE — Telephone Encounter (Signed)
Call to pt's home. LM on VM that Rx was called to [facility]. 09-27-12 941 aa

## 2012-09-27 NOTE — Telephone Encounter (Signed)
Called RX for Ambien into [facility]. 09-27-12 934 aa

## 2012-11-30 ENCOUNTER — Emergency Department (HOSPITAL_COMMUNITY): Payer: No Typology Code available for payment source

## 2012-11-30 ENCOUNTER — Encounter (HOSPITAL_COMMUNITY): Payer: Self-pay

## 2012-11-30 ENCOUNTER — Emergency Department (HOSPITAL_COMMUNITY)
Admission: EM | Admit: 2012-11-30 | Discharge: 2012-11-30 | Disposition: A | Discharge status: Home / Self Care | Payer: No Typology Code available for payment source | Attending: Emergency Medicine | Admitting: Emergency Medicine

## 2012-11-30 DIAGNOSIS — Z8739 Personal history of other diseases of the musculoskeletal system and connective tissue: Secondary | ICD-10-CM | POA: Insufficient documentation

## 2012-11-30 DIAGNOSIS — Z853 Personal history of malignant neoplasm of breast: Secondary | ICD-10-CM | POA: Insufficient documentation

## 2012-11-30 DIAGNOSIS — S134XXA Sprain of ligaments of cervical spine, initial encounter: Secondary | ICD-10-CM

## 2012-11-30 DIAGNOSIS — S139XXA Sprain of joints and ligaments of unspecified parts of neck, initial encounter: Secondary | ICD-10-CM | POA: Insufficient documentation

## 2012-11-30 DIAGNOSIS — Y9389 Activity, other specified: Secondary | ICD-10-CM | POA: Insufficient documentation

## 2012-11-30 DIAGNOSIS — Z79899 Other long term (current) drug therapy: Secondary | ICD-10-CM | POA: Insufficient documentation

## 2012-11-30 MED ORDER — DIAZEPAM 5 MG PO TABS: 5.0000 mg | ORAL_TABLET | Freq: Four times a day (QID) | ORAL | Status: DC | PRN

## 2012-11-30 MED ORDER — DIAZEPAM 5 MG/ML IJ SOLN
5.0000 mg | Freq: Once | INTRAMUSCULAR | Status: AC
  Administered 2012-11-30: 5 mg via INTRAVENOUS
  Filled 2012-11-30: qty 2

## 2012-11-30 MED ORDER — KETOROLAC TROMETHAMINE 60 MG/2ML IM SOLN
60.0000 mg | Freq: Once | INTRAMUSCULAR | Status: AC
  Administered 2012-11-30: 60 mg via INTRAMUSCULAR
  Filled 2012-11-30: qty 2

## 2012-11-30 NOTE — ED Notes (Signed)
Margaret [id number]
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
## 2012-11-30 NOTE — ED Notes (Signed)
Pt ambulatory to bathroom with steady gait.
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
## 2013-01-24 ENCOUNTER — Encounter: Payer: Self-pay | Admitting: Family Medicine

## 2013-01-24 ENCOUNTER — Ambulatory Visit (INDEPENDENT_AMBULATORY_CARE_PROVIDER_SITE_OTHER): Payer: Medicare Other | Admitting: Family Medicine

## 2013-01-24 VITALS — BP 149/66 | HR 91 | Temp 98.4°F | Wt 154.0 lb

## 2013-01-24 DIAGNOSIS — J3489 Other specified disorders of nose and nasal sinuses: Secondary | ICD-10-CM

## 2013-01-24 DIAGNOSIS — R0981 Nasal congestion: Secondary | ICD-10-CM | POA: Insufficient documentation

## 2013-01-24 MED ORDER — FLUTICASONE PROPIONATE 50 MCG/ACT NA SUSP: 2.0000 | Freq: Every day | NASAL | Status: DC

## 2013-01-24 NOTE — Progress Notes (Signed)
Margaret [name] is a 72 y.o. female who presents to [facility] today for SD appt for sinus infection.  Sinus infection for 1 month. Started as a head cold. R sided HA becoming obnoxious. Associated constant post nasal drainage. Occasional cough w/ phlegm. Denies fever, n/v/d/c, fatigued. Tried OTC mucinex, tylenol cold, w/o much relief. No h/o allergies or previous sinus infections. Coffee helps w/ HA.   The following portions of the patient's history were reviewed and updated as appropriate: allergies, current medications, past medical history, family and social history, and problem list.  Patient is a nonsmoker.  Past Medical History  Diagnosis Date  . Cancer 2009    Breast  . Arthritis     ROS as above otherwise neg.    Medications reviewed. Current Outpatient Prescriptions  Medication Sig Dispense Refill  . atorvastatin (LIPITOR) 20 MG tablet Take 20 mg by mouth daily.       . calcium-vitamin D 250-100 MG-UNIT per tablet Take 1 tablet by mouth 2 (two) times daily.        [name] darifenacin (ENABLEX) 7.5 MG 24 hr tablet Take 7.5 mg by mouth daily.        . diazepam (VALIUM) 5 MG tablet Take 1 tablet (5 mg total) by mouth every 6 (six) hours as needed for anxiety (spasms).  10 tablet  0  . exemestane (AROMASIN) 25 MG tablet Take 25 mg by mouth daily.      [name] levothyroxine (SYNTHROID, LEVOTHROID) 100 MCG tablet Take 100 mcg by mouth daily.      . Multiple Vitamins-Minerals (MULTIVITAMIN WITH MINERALS) tablet Take 1 tablet by mouth daily.        [name] omeprazole (PRILOSEC) 20 MG capsule Take 20 mg by mouth daily.        [name] venlafaxine XR (EFFEXOR-XR) 37.5 MG 24 hr capsule Take 37.5 mg by mouth daily.      [name] zolpidem (AMBIEN) 10 MG tablet Take 0.5-1 tablets (5-10 mg total) by mouth at bedtime as needed for sleep. Take 1/2 tablet by mouth at bedtime and a second  1/2 tablet 1-2  hours later if needed.  30 tablet  5   No current facility-administered medications for this visit.    Exam: BP 149/66   Pulse 91  Temp(Src) 98.4 F (36.9 C) (Oral)  Wt 154 lb (69.854 kg)  BMI 26.42 kg/m2 Gen: Well NAD HEENT: EOMI,  MMM, boggy nasal turbinates, no oropharyngeal exudate or lesion.  Lungs: CTABL Nl WOB Heart: RRR no MRG Abd: NABS, NT, ND Exts: Non edematous BL  LE, warm and well perfused.  Neuro: CN 2-12 grossly intact.   No results found for this or any previous visit (from the past 72 hour(s)).

## 2013-01-24 NOTE — Patient Instructions (Addendum)
Your symptoms are likely due to a viral or allergic sinus irritation Please start using flonase every night. This may take 1-2 weeks to fully start working Please start taking Advil 200mg  every 4 hours or 400-600mg  every 6 hours for the next few days for your headache and sinus congestion Please start using a saline nasal spray 1 hour prior to using the flonase to help wash out your nasal passages and dry up the secretions Please consider sudafed or afrin if needed. Please call Dr. Leveda Anna if your symptoms do not improve.

## 2013-01-24 NOTE — Assessment & Plan Note (Addendum)
Viral vs allergic etiology No indicatino for ABX at this time Recommended Flonase, NSAIDs, Saline nasal spray Pt to wait on sudafed due to how it makes her feel Pt to wait on Afrin due to potential rebound

## 2013-01-26 ENCOUNTER — Ambulatory Visit: Payer: Self-pay | Admitting: Certified Nurse Midwife

## 2013-01-30 ENCOUNTER — Telehealth: Payer: Self-pay | Admitting: *Deleted

## 2013-01-30 NOTE — Telephone Encounter (Signed)
Pt lm for me to change he appt for 02/13/13 to after 3pm. Lm gv appt for 3:45pm.....td

## 2013-02-03 ENCOUNTER — Encounter: Payer: Self-pay | Admitting: Family Medicine

## 2013-02-03 ENCOUNTER — Ambulatory Visit (INDEPENDENT_AMBULATORY_CARE_PROVIDER_SITE_OTHER): Payer: Medicare Other | Admitting: Family Medicine

## 2013-02-03 VITALS — BP 131/54 | HR 95 | Temp 98.1°F | Ht 64.0 in | Wt 154.4 lb

## 2013-02-03 DIAGNOSIS — J189 Pneumonia, unspecified organism: Secondary | ICD-10-CM

## 2013-02-03 DIAGNOSIS — R7309 Other abnormal glucose: Secondary | ICD-10-CM

## 2013-02-03 DIAGNOSIS — G4700 Insomnia, unspecified: Secondary | ICD-10-CM

## 2013-02-03 DIAGNOSIS — R739 Hyperglycemia, unspecified: Secondary | ICD-10-CM

## 2013-02-03 DIAGNOSIS — K117 Disturbances of salivary secretion: Secondary | ICD-10-CM | POA: Insufficient documentation

## 2013-02-03 LAB — POCT GLYCOSYLATED HEMOGLOBIN (HGB A1C): Hemoglobin A1C: 5.6

## 2013-02-03 MED ORDER — AZITHROMYCIN 500 MG PO TABS: 500.0000 mg | ORAL_TABLET | Freq: Every day | ORAL | Status: DC

## 2013-02-03 NOTE — Assessment & Plan Note (Signed)
No reason for CXR unless fails to respond to antibiotics.

## 2013-02-03 NOTE — Patient Instructions (Addendum)
Yes, you have pneumonia.  Get the antibiotic today. My nurse will help you schedule the sleep study. Stop the effexor - either cold [place] or take a pill every other day for a week.  Watch for any resurgence in your anxiety. We will deal more with the dry mouth after the sleep study and you are off the effexor.

## 2013-02-03 NOTE — Assessment & Plan Note (Signed)
Symptoms consistent with OSA despite a body habitus that is atypical (thin and long neck.)

## 2013-02-03 NOTE — Assessment & Plan Note (Signed)
Try DC effexor and see if OSA.  Consider meds if persists.

## 2013-02-03 NOTE — Progress Notes (Signed)
  Subjective:    Patient ID: Margaret [name], female    DOB: 11-18-1940, 72 y.o.   MRN: [id number]  HPI Progress illness over past week started as head cold now in chest with productive cough.  No documented fever.  Mild shortness of breath. Dry mouth a problem Anxiety no longer a problem.  Would like to try off effexor. In addition to her chronic insomnia and frequent nocturnal awakening, her sister recently stayed over and told her she heard very loud snoring and that patient would stop breathing for brief periods.    Review of Systems     Objective:   Physical ExamTMs normal Throat normal Neck no sig nodes. Lungs bilateral end inspiratory wheezes.       Assessment & Plan:

## 2013-02-03 NOTE — Addendum Note (Signed)
Addended by: Jimmy Footman K on: 02/03/2013 01:59 PM   Modules accepted: Orders

## 2013-02-07 ENCOUNTER — Ambulatory Visit: Payer: Self-pay | Admitting: Certified Nurse Midwife

## 2013-02-13 ENCOUNTER — Other Ambulatory Visit: Payer: Medicare Other

## 2013-02-13 ENCOUNTER — Other Ambulatory Visit (HOSPITAL_BASED_OUTPATIENT_CLINIC_OR_DEPARTMENT_OTHER): Payer: Medicare Other | Admitting: Lab

## 2013-02-13 ENCOUNTER — Other Ambulatory Visit: Payer: Medicare Other | Admitting: Lab

## 2013-02-13 DIAGNOSIS — E559 Vitamin D deficiency, unspecified: Secondary | ICD-10-CM

## 2013-02-13 DIAGNOSIS — C773 Secondary and unspecified malignant neoplasm of axilla and upper limb lymph nodes: Secondary | ICD-10-CM

## 2013-02-13 DIAGNOSIS — M8580 Other specified disorders of bone density and structure, unspecified site: Secondary | ICD-10-CM

## 2013-02-13 DIAGNOSIS — C50919 Malignant neoplasm of unspecified site of unspecified female breast: Secondary | ICD-10-CM

## 2013-02-13 LAB — CBC WITH DIFFERENTIAL/PLATELET
BASO%: 0.6 % (ref 0.0–2.0)
LYMPH%: 18.2 % (ref 14.0–49.7)
MCHC: 33.3 g/dL (ref 31.5–36.0)
MONO#: 0.6 10*3/uL (ref 0.1–0.9)
NEUT#: 4.9 10*3/uL (ref 1.5–6.5)
Platelets: 250 10*3/uL (ref 145–400)
RBC: 4.25 10*6/uL (ref 3.70–5.45)
RDW: 13.8 % (ref 11.2–14.5)
WBC: 7.1 10*3/uL (ref 3.9–10.3)
lymph#: 1.3 10*3/uL (ref 0.9–3.3)

## 2013-02-14 LAB — VITAMIN D 25 HYDROXY (VIT D DEFICIENCY, FRACTURES): Vit D, 25-Hydroxy: 50 ng/mL (ref 30–89)

## 2013-02-20 ENCOUNTER — Ambulatory Visit (HOSPITAL_BASED_OUTPATIENT_CLINIC_OR_DEPARTMENT_OTHER): Payer: Medicare Other | Admitting: Oncology

## 2013-02-20 ENCOUNTER — Telehealth: Payer: Self-pay | Admitting: *Deleted

## 2013-02-20 ENCOUNTER — Other Ambulatory Visit: Payer: Self-pay | Admitting: Emergency Medicine

## 2013-02-20 VITALS — BP 135/72 | HR 93 | Temp 98.1°F | Resp 20 | Ht 64.0 in | Wt 157.4 lb

## 2013-02-20 DIAGNOSIS — Z170 Estrogen receptor positive status [ER+]: Secondary | ICD-10-CM

## 2013-02-20 DIAGNOSIS — N3941 Urge incontinence: Secondary | ICD-10-CM

## 2013-02-20 DIAGNOSIS — C50919 Malignant neoplasm of unspecified site of unspecified female breast: Secondary | ICD-10-CM

## 2013-02-20 MED ORDER — EXEMESTANE 25 MG PO TABS: 25.0000 mg | ORAL_TABLET | Freq: Every day | ORAL | Status: DC
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
## 2013-02-20 NOTE — Telephone Encounter (Signed)
appts made and printed...td 

## 2013-02-20 NOTE — Addendum Note (Signed)
Addended by: Lorenza Evangelist A on: 02/20/2013 10:50 AM   Modules accepted: Orders

## 2013-02-23 ENCOUNTER — Ambulatory Visit (HOSPITAL_BASED_OUTPATIENT_CLINIC_OR_DEPARTMENT_OTHER): Payer: Medicare Other | Attending: Family Medicine

## 2013-02-23 ENCOUNTER — Other Ambulatory Visit: Payer: Self-pay

## 2013-02-23 DIAGNOSIS — J189 Pneumonia, unspecified organism: Secondary | ICD-10-CM

## 2013-02-23 DIAGNOSIS — G4700 Insomnia, unspecified: Secondary | ICD-10-CM

## 2013-02-23 DIAGNOSIS — G4733 Obstructive sleep apnea (adult) (pediatric): Secondary | ICD-10-CM | POA: Insufficient documentation

## 2013-02-23 MED ORDER — DARIFENACIN HYDROBROMIDE ER 15 MG PO TB24: 15.0000 mg | ORAL_TABLET | Freq: Every day | ORAL | Status: DC

## 2013-02-26 DIAGNOSIS — G4733 Obstructive sleep apnea (adult) (pediatric): Secondary | ICD-10-CM

## 2013-02-26 DIAGNOSIS — R0609 Other forms of dyspnea: Secondary | ICD-10-CM

## 2013-02-26 DIAGNOSIS — R0989 Other specified symptoms and signs involving the circulatory and respiratory systems: Secondary | ICD-10-CM

## 2013-02-26 NOTE — Procedures (Signed)
NAMETYNISA, VOHS              ACCOUNT NO.:  [id number]  MEDICAL RECORD NO.:  [id number]          PATIENT TYPE:  OUT  LOCATION:  SLEEP CENTER                 FACILITY:  [facility]  PHYSICIAN:  Clinton D. Maple Hudson, MD, FCCP, FACPDATE OF BIRTH:  04[birth year]/41  DATE OF STUDY:  02/23/2013                           NOCTURNAL POLYSOMNOGRAM  REFERRING PHYSICIAN:  Chrissie Noa A. Leveda Anna, M.D.  INDICATION FOR STUDY:  Insomnia with sleep apnea.  EPWORTH SLEEPINESS SCORE:  7/24.  BMI 27, weight 156 pounds, height 64 inches, neck 13 inches.  HOME MEDICATIONS:  Charted per review.  SLEEP ARCHITECTURE:  Total sleep time 130 minutes with sleep efficiency 85.2%.  Stage I was 18.1%, stage II 81.9%.  Stage III and REM were absent.  Sleep latency 11.5 minutes, awake after sleep onset 11 minutes. Arousal index 16.6.  Bedtime medication:  None.  RESPIRATORY DATA:  Split study protocol.  Apnea/hypopnea index (AHI) 96.5 per hour.  A total of 209 events were scored including 186 obstructive apneas, 1 mixed apnea, 22 hypopneas.  Events were not positional.  CPAP was titrated to 15 CWP, AHI 0 per hour.  She wore a medium ResMed AirFit F10 full-face mask with heated humidifier.  OXYGEN DATA:  Moderate snoring with oxygen desaturation to a nadir of 65% on room air before CPAP.  With CPAP control, snoring was prevented and mean oxygen saturation held 91.8% on room air.  CARDIAC DATA:  Sinus rhythm.  MOVEMENT/PARASOMNIA:  Limb movements were noted, but with little apparent effect on sleep.  Bathroom x3.  IMPRESSION/RECOMMENDATION: 1. Severe obstructive sleep apnea/hypopnea syndrome, AHI 96.5 per hour     with non-positional events.  Moderate snoring before CPAP with     oxygen desaturation to a nadir of 65% on room air. 2. Successful CPAP titration to 15 CWP, AHI 0 per hour.  She wore a     medium ResMed AirFit F10 full-face mask with heated humidifier.     Snoring was prevented and mean oxygen saturation held  91.8% on room     air.     Clinton D. Maple Hudson, MD, [facility], FACP Diplomate, American Board of Sleep Medicine    CDY/MEDQ  D:  02/26/2013 12:34:42  T:  02/26/2013 12:47:14  Job:  [id number]

## 2013-03-07 ENCOUNTER — Telehealth: Payer: Self-pay | Admitting: Family Medicine

## 2013-03-07 ENCOUNTER — Encounter: Payer: Self-pay | Admitting: Family Medicine

## 2013-03-07 NOTE — Telephone Encounter (Signed)
Patient was recently diagnosed w/ sleep apnea and now needs the necessary equipment. Patient does not know if she will need to come in to see Dr. Leveda Anna first or if he can just give prescriptions for equipment.

## 2013-03-10 NOTE — Telephone Encounter (Signed)
Sent to [facility]

## 2013-03-23 ENCOUNTER — Other Ambulatory Visit: Payer: Self-pay | Admitting: *Deleted

## 2013-03-23 NOTE — Telephone Encounter (Signed)
Need chart please

## 2013-03-23 NOTE — Telephone Encounter (Signed)
eScribe request for refill on LIPITOR Last filled - 01/20/12 X 1 YEAR Last AEX - 01/20/12 Last Cholesterol check August 2013 Next AEX - 04/14/13 Please advise refills.

## 2013-03-23 NOTE — Telephone Encounter (Signed)
Chart in your basket. Thanks,

## 2013-03-24 MED ORDER — ATORVASTATIN CALCIUM 20 MG PO TABS: 20.0000 mg | ORAL_TABLET | Freq: Every day | ORAL | Status: DC

## 2013-03-27 ENCOUNTER — Telehealth: Payer: Self-pay | Admitting: Obstetrics & Gynecology

## 2013-03-27 MED ORDER — LEVOTHYROXINE SODIUM 100 MCG PO TABS: 100.0000 ug | ORAL_TABLET | Freq: Every day | ORAL | Status: DC

## 2013-03-27 NOTE — Telephone Encounter (Signed)
Pt has aex 04/14/13. rx sent thru to get patient to appt

## 2013-03-27 NOTE — Telephone Encounter (Signed)
Margaret [name] is calling from the pharmacy about a refill of a prescription she sent last week and only got a response on one med. She needs a refill of Synthroid.

## 2013-04-14 ENCOUNTER — Ambulatory Visit (INDEPENDENT_AMBULATORY_CARE_PROVIDER_SITE_OTHER): Payer: Medicare Other | Admitting: Obstetrics & Gynecology

## 2013-04-14 ENCOUNTER — Encounter: Payer: Self-pay | Admitting: Obstetrics & Gynecology

## 2013-04-14 VITALS — BP 154/86 | HR 80 | Resp 20 | Ht 63.0 in | Wt 159.8 lb

## 2013-04-14 DIAGNOSIS — G4700 Insomnia, unspecified: Secondary | ICD-10-CM

## 2013-04-14 DIAGNOSIS — Z01419 Encounter for gynecological examination (general) (routine) without abnormal findings: Secondary | ICD-10-CM

## 2013-04-14 DIAGNOSIS — E039 Hypothyroidism, unspecified: Secondary | ICD-10-CM

## 2013-04-14 DIAGNOSIS — Z0000 Encounter for general adult medical examination without abnormal findings: Secondary | ICD-10-CM

## 2013-04-14 LAB — POCT URINALYSIS DIPSTICK
Bilirubin, UA: NEGATIVE
Ketones, UA: NEGATIVE
Protein, UA: NEGATIVE
pH, UA: 7

## 2013-04-14 MED ORDER — LEVOTHYROXINE SODIUM 100 MCG PO TABS: 100.0000 ug | ORAL_TABLET | Freq: Every day | ORAL | Status: DC

## 2013-04-14 MED ORDER — ZOLPIDEM TARTRATE 10 MG PO TABS: ORAL_TABLET | ORAL | Status: DC

## 2013-04-14 MED ORDER — ATORVASTATIN CALCIUM 20 MG PO TABS: 20.0000 mg | ORAL_TABLET | Freq: Every day | ORAL | Status: DC

## 2013-04-14 MED ORDER — DARIFENACIN HYDROBROMIDE ER 15 MG PO TB24: 15.0000 mg | ORAL_TABLET | Freq: Every day | ORAL | Status: DC
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
## 2013-04-14 NOTE — Patient Instructions (Signed)

## 2013-04-15 LAB — TSH: TSH: 1.339 u[IU]/mL (ref 0.350–4.500)

## 2013-04-17 ENCOUNTER — Telehealth: Payer: Self-pay

## 2013-04-17 NOTE — Telephone Encounter (Signed)
10/14 lmtcb//kn

## 2013-04-17 NOTE — Telephone Encounter (Signed)
Message copied by Elisha Headland on Mon Apr 17, 2013 12:49 PM ------      Message from: Jerene Bears      Created: Sat Apr 15, 2013  6:05 AM       Inform tsh normal.  rx was sent to pharmacy for thyroid medication. ------

## 2013-04-18 ENCOUNTER — Telehealth: Payer: Self-pay | Admitting: Family Medicine

## 2013-04-18 NOTE — Telephone Encounter (Signed)
Left message on voicemail requesting name of place where machine came from. Janiene Aarons, Vir[place] Bouquet

## 2013-04-18 NOTE — Telephone Encounter (Signed)
Pt called because the sleep apnea machine needs adjusting. It keeps waking her up. JW

## 2013-04-20 ENCOUNTER — Telehealth: Payer: Self-pay | Admitting: Family Medicine

## 2013-04-20 DIAGNOSIS — G4733 Obstructive sleep apnea (adult) (pediatric): Secondary | ICD-10-CM

## 2013-04-20 NOTE — Telephone Encounter (Signed)
Patient is returning Margaret [name]'s call.

## 2013-04-20 NOTE — Telephone Encounter (Signed)
Patient calls stating she will need a order from Dr. Leveda Anna decreasing the air pressure in her CPAP machine. Patient states it is too high and wakes her up at night. Please call patient.

## 2013-04-21 NOTE — Telephone Encounter (Signed)
Lmtcb//kn 

## 2013-04-21 NOTE — Telephone Encounter (Signed)
Patient notified of all results. 

## 2013-04-21 NOTE — Telephone Encounter (Signed)
Dear [facility] Team Please let them know Dr Leveda Anna is out of office until next week and he will address this them Then plz route this to Dr Mechele Claude! Denny Levy

## 2013-04-21 NOTE — Telephone Encounter (Signed)
Left message on voicemail. Margaret [name] S  

## 2013-04-24 ENCOUNTER — Other Ambulatory Visit: Payer: Self-pay | Admitting: Dermatology

## 2013-04-24 NOTE — Assessment & Plan Note (Signed)
Called patient.  She cannot tolerate 15 cm  Of H2O.  Will decrease to 12 cm.

## 2013-06-13 ENCOUNTER — Encounter: Payer: Self-pay | Admitting: Family Medicine

## 2013-06-14 ENCOUNTER — Ambulatory Visit (INDEPENDENT_AMBULATORY_CARE_PROVIDER_SITE_OTHER): Payer: Medicare Other | Admitting: Family Medicine

## 2013-06-14 ENCOUNTER — Encounter: Payer: Self-pay | Admitting: Family Medicine

## 2013-06-14 VITALS — BP 128/67 | HR 94 | Temp 97.3°F | Ht 64.0 in | Wt 162.5 lb

## 2013-06-14 DIAGNOSIS — K219 Gastro-esophageal reflux disease without esophagitis: Secondary | ICD-10-CM

## 2013-06-14 DIAGNOSIS — G4733 Obstructive sleep apnea (adult) (pediatric): Secondary | ICD-10-CM

## 2013-06-14 DIAGNOSIS — E7800 Pure hypercholesterolemia, unspecified: Secondary | ICD-10-CM

## 2013-06-14 DIAGNOSIS — Z23 Encounter for immunization: Secondary | ICD-10-CM

## 2013-06-14 DIAGNOSIS — R739 Hyperglycemia, unspecified: Secondary | ICD-10-CM

## 2013-06-14 DIAGNOSIS — E039 Hypothyroidism, unspecified: Secondary | ICD-10-CM

## 2013-06-14 DIAGNOSIS — R7309 Other abnormal glucose: Secondary | ICD-10-CM

## 2013-06-14 LAB — LIPID PANEL
Cholesterol: 152 mg/dL (ref 0–200)
Total CHOL/HDL Ratio: 2.7 Ratio
Triglycerides: 220 mg/dL — ABNORMAL HIGH (ref <150)
VLDL: 44 mg/dL — ABNORMAL HIGH (ref 0–40)

## 2013-06-14 MED ORDER — FAMOTIDINE 40 MG PO TABS: 40.0000 mg | ORAL_TABLET | Freq: Every day | ORAL | Status: DC

## 2013-06-14 NOTE — Assessment & Plan Note (Signed)
Recent TSH OK.

## 2013-06-14 NOTE — Progress Notes (Signed)
   Subjective:    Patient ID: Margaret [name], female    DOB: 22-Dec-1940, 72 y.o.   MRN: [id number]  HPI  Here for a recheck of problems.  Doing well.  No complaints. Sleep Apnea.  Leaking around the mask.  Wants pressure lowered.  Per recent report, on 15 cm H20.  Handwrote Rx to decrease to 13. Pulm fibrosis.  Denies cough or SOB. Hypercholesterol no complains, needs lipid panel Due for flu shot. No rec. To get conjugated pneumonia vaccine. Putting house on the market and moving to the beach.  We will miss each other.      Review of Systems     Objective:   Physical Exam Lungs clear Cardiac RRR without m or g Abd benign        Assessment & Plan:

## 2013-06-14 NOTE — Patient Instructions (Signed)
Let me know if I need to do anything else to change your CPAP settings. I will call with cholesterol results. You got a flu shot and the new pneumonia vaccine today. You are so healthy that I think once a year is fine.

## 2013-06-14 NOTE — Assessment & Plan Note (Signed)
Runs high BS but A1C 2014 clearly not diabetic.

## 2013-06-14 NOTE — Assessment & Plan Note (Signed)
Asymptomatic.  Try switch to H2 blocker to avoid risk of osteoporosis.

## 2013-06-14 NOTE — Assessment & Plan Note (Signed)
  Decrease to 13 cm H2o

## 2013-06-15 ENCOUNTER — Encounter: Payer: Self-pay | Admitting: Family Medicine
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
## 2013-07-21 ENCOUNTER — Other Ambulatory Visit: Payer: Self-pay | Admitting: *Deleted

## 2013-07-21 DIAGNOSIS — C50919 Malignant neoplasm of unspecified site of unspecified female breast: Secondary | ICD-10-CM

## 2013-07-24 ENCOUNTER — Encounter: Payer: Self-pay | Admitting: *Deleted

## 2013-07-24 ENCOUNTER — Other Ambulatory Visit (HOSPITAL_BASED_OUTPATIENT_CLINIC_OR_DEPARTMENT_OTHER): Payer: Medicare Other

## 2013-07-24 ENCOUNTER — Ambulatory Visit (HOSPITAL_BASED_OUTPATIENT_CLINIC_OR_DEPARTMENT_OTHER): Payer: Medicare Other | Admitting: Oncology

## 2013-07-24 VITALS — BP 179/80 | HR 86 | Temp 98.3°F | Resp 20 | Ht 64.0 in | Wt 161.0 lb

## 2013-07-24 DIAGNOSIS — Z170 Estrogen receptor positive status [ER+]: Secondary | ICD-10-CM

## 2013-07-24 DIAGNOSIS — G4733 Obstructive sleep apnea (adult) (pediatric): Secondary | ICD-10-CM

## 2013-07-24 DIAGNOSIS — C50919 Malignant neoplasm of unspecified site of unspecified female breast: Secondary | ICD-10-CM

## 2013-07-24 DIAGNOSIS — M1990 Unspecified osteoarthritis, unspecified site: Secondary | ICD-10-CM

## 2013-07-24 DIAGNOSIS — M8580 Other specified disorders of bone density and structure, unspecified site: Secondary | ICD-10-CM

## 2013-07-24 LAB — CBC WITH DIFFERENTIAL/PLATELET
BASO%: 0.8 % (ref 0.0–2.0)
Basophils Absolute: 0.1 10*3/uL (ref 0.0–0.1)
EOS%: 1.9 % (ref 0.0–7.0)
Eosinophils Absolute: 0.2 10*3/uL (ref 0.0–0.5)
HEMATOCRIT: 42.3 % (ref 34.8–46.6)
HGB: 14 g/dL (ref 11.6–15.9)
LYMPH#: 1.1 10*3/uL (ref 0.9–3.3)
LYMPH%: 12.9 % — ABNORMAL LOW (ref 14.0–49.7)
MCH: 30.9 pg (ref 25.1–34.0)
MCHC: 33.1 g/dL (ref 31.5–36.0)
MCV: 93.4 fL (ref 79.5–101.0)
MONO#: 0.6 10*3/uL (ref 0.1–0.9)
MONO%: 7.4 % (ref 0.0–14.0)
NEUT#: 6.5 10*3/uL (ref 1.5–6.5)
NEUT%: 77 % — AB (ref 38.4–76.8)
Platelets: 269 10*3/uL (ref 145–400)
RBC: 4.53 10*6/uL (ref 3.70–5.45)
RDW: 13.9 % (ref 11.2–14.5)
WBC: 8.5 10*3/uL (ref 3.9–10.3)

## 2013-07-24 LAB — COMPREHENSIVE METABOLIC PANEL (CC13)
ALT: 41 U/L (ref 0–55)
AST: 24 U/L (ref 5–34)
Albumin: 3.9 g/dL (ref 3.5–5.0)
Alkaline Phosphatase: 113 U/L (ref 40–150)
Anion Gap: 9 mEq/L (ref 3–11)
BUN: 15.2 mg/dL (ref 7.0–26.0)
CALCIUM: 9.7 mg/dL (ref 8.4–10.4)
CO2: 27 mEq/L (ref 22–29)
CREATININE: 0.7 mg/dL (ref 0.6–1.1)
Chloride: 103 mEq/L (ref 98–109)
Glucose: 91 mg/dl (ref 70–140)
Potassium: 4.5 mEq/L (ref 3.5–5.1)
Sodium: 140 mEq/L (ref 136–145)
TOTAL PROTEIN: 7.2 g/dL (ref 6.4–8.3)
Total Bilirubin: 0.51 mg/dL (ref 0.20–1.20)

## 2013-07-24 MED ORDER — VENLAFAXINE HCL ER 37.5 MG PO CP24: 37.5000 mg | ORAL_CAPSULE | Freq: Every day | ORAL | Status: DC
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
## 2013-07-24 NOTE — Progress Notes (Signed)
Dr. Jana Hakim graduated pt today and gave me the camera with her pics on it.  I sent them to Linda Hedges in [place] to take care of.

## 2013-07-25 ENCOUNTER — Telehealth: Payer: Self-pay | Admitting: Oncology

## 2013-07-25 NOTE — Telephone Encounter (Signed)
S/w the pt and she is aware of her bone density appt on 08/03/2013@9 :30am at the [facility]. Faxed the order over to [phone].

## 2013-08-07 ENCOUNTER — Encounter: Payer: Self-pay | Admitting: Family Medicine

## 2013-08-07 ENCOUNTER — Other Ambulatory Visit: Payer: Self-pay | Admitting: Family Medicine

## 2013-08-07 DIAGNOSIS — M8580 Other specified disorders of bone density and structure, unspecified site: Secondary | ICD-10-CM

## 2013-08-07 NOTE — Progress Notes (Signed)
Patient ID: Margaret [name], female   DOB: [birth year], 73 y.o.   MRN: [id number] Received results of 08/03/13 bone scan by [facility].  Difficult to interpret: Lumbar spine t=-0.5, slightly better than -0.6 11/23/11 Hip t=-1.6 unchanged from 11/23/11 Radius, not done in 2013 is -3.6 which pushes her into osteoporosis range.    Called and left message that we need to discuss.

## 2013-08-08 ENCOUNTER — Telehealth: Payer: Self-pay | Admitting: Family Medicine

## 2013-08-08 DIAGNOSIS — M8580 Other specified disorders of bone density and structure, unspecified site: Secondary | ICD-10-CM

## 2013-08-08 NOTE — Telephone Encounter (Signed)
Patient returns Dr. Lowella Bandy call. Attempted to contact Dr. Andria Frames his office, but he is in a meeting. Please call patient back with results. Thanks!

## 2013-08-10 NOTE — Telephone Encounter (Signed)
Got in touch with her and gave her confusing results.  Hip and spine OK and unchanged.  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  We discussed pros and cons of further treatment.  We decided stay on calcium and vit D.  No new meds (she has stopped breast ca chemo).  Recheck in two years.  She is doing Charity fundraiser.

## 2013-09-22 ENCOUNTER — Ambulatory Visit (INDEPENDENT_AMBULATORY_CARE_PROVIDER_SITE_OTHER): Payer: Medicare Other | Admitting: Sports Medicine

## 2013-09-22 ENCOUNTER — Ambulatory Visit
Admission: RE | Admit: 2013-09-22 | Discharge: 2013-09-22 | Disposition: A | Discharge status: Home / Self Care | Payer: Medicare Other | Source: Ambulatory Visit | Attending: Family Medicine | Admitting: Family Medicine

## 2013-09-22 ENCOUNTER — Encounter: Payer: Self-pay | Admitting: Sports Medicine

## 2013-09-22 VITALS — BP 129/85 | HR 100 | Temp 100.1°F | Ht 64.0 in | Wt 158.2 lb

## 2013-09-22 DIAGNOSIS — R059 Cough, unspecified: Secondary | ICD-10-CM

## 2013-09-22 DIAGNOSIS — R05 Cough: Secondary | ICD-10-CM

## 2013-09-22 MED ORDER — OSELTAMIVIR PHOSPHATE 75 MG PO CAPS: 75.0000 mg | ORAL_CAPSULE | Freq: Two times a day (BID) | ORAL | Status: DC

## 2013-09-22 MED ORDER — ALBUTEROL SULFATE HFA 108 (90 BASE) MCG/ACT IN AERS: 2.0000 | INHALATION_SPRAY | Freq: Four times a day (QID) | RESPIRATORY_TRACT | Status: DC | PRN

## 2013-09-22 NOTE — Progress Notes (Signed)
CXR with non-specific findings.  No focal infiltrate.  Overall progression of Intersitial process.  Given all sx consistent with Influenza like ilnness within first 48 hours.  Treat with Tamiflu.   I Called and spoke with pt regarding findings.

## 2013-09-22 NOTE — Assessment & Plan Note (Addendum)
Problem Based Documentation:    Subjective Report:  Less than 24 hour onset of cough, fevers, chills, fatigue.  Taking Tylenol, Motrin and Mucinex.  No significant shortness of breath, dyspnea, dyspnea on exertion, chest pain or orthopnea, PND and lower extremity edema.  No nausea, vomiting, diarrhea or abdominal pain.  No difficulty swallowing or throat pain.     Assessment & Plan & Follow up Issues:  Acute condition.  92% on room air.  Status post influenza vaccine this year.  Known OSA but no chronic respiratory medications.  History of walking pneumonia last year.  1. Chest x-ray to evaluate for pneumonia versus flu 2. Albuterol for wheezing 3. Symptomatic tx > Followup respiratory status.

## 2013-09-22 NOTE — Progress Notes (Signed)
  Margaret [name] - 73 y.o. female MRN [id number]  Date of birth: October 10, 1940  SUBJECTIVE:     CC: chest congestion See problem based charting for additional subjective (including HPI, Interval History & ROS)   HISTORY: Wt Readings from Last 3 Encounters:  09/22/13 158 lb 3.2 oz (71.759 kg)  07/24/13 161 lb (73.029 kg)  06/14/13 162 lb 8 oz (73.71 kg)   BP Readings from Last 3 Encounters:  09/22/13 129/85  07/24/13 179/80  06/14/13 128/67    History  Smoking status  . Never Smoker   Smokeless tobacco  . Never Used   No health maintenance topics applied.  Otherwise past Medical, Surgical, Social, and Family History Reviewed per EMR Medications and Allergies reviewed and updated per below.  VITALS: BP 129/85  Pulse 100  Temp(Src) 100.1 F (37.8 C) (Oral)  Ht 5\' 4"  (1.626 m)  Wt 158 lb 3.2 oz (71.759 kg)  BMI 27.14 kg/m2  SpO2 92%  LMP 07/06/1982  PHYSICAL EXAM: GENERAL: Elderly thin caucasian  female. In no discomfort; no respiratory distress  PSYCH: alert and appropriate, good insight   HNEENT:  bilateral tympanic membranes pearly gray without erythema or exudate  Posterior oropharyngeal erythema without exudate, tonsillar hypertrophy 1+/4  Scattered anterior lymphadenopathy, no supraclavicular lymphadenopathy   CARDIO: RRR, S1/S2 heard, no murmur  LUNGS:  good air movement throughout with crackles with end expiratory wheezes in posterior lung fields   ABDOMEN:   EXTREM:  Warm, well perfused.  Moves all 4 extremities spontaneously; no lateralization.  Distal pulses 2+/4.  no pretibial edema.  GU:   SKIN:     MEDICATIONS, LABS & OTHER ORDERS: Previous Medications   ATORVASTATIN (LIPITOR) 20 MG TABLET    Take 1 tablet (20 mg total) by mouth daily.   CALCIUM-VITAMIN D 250-100 MG-UNIT PER TABLET    Take 1 tablet by mouth 2 (two) times daily.     DARIFENACIN (ENABLEX) 15 MG 24 HR TABLET    Take 1 tablet (15 mg total) by mouth daily.   EXEMESTANE (AROMASIN) 25 MG TABLET        FAMOTIDINE (PEPCID) 40 MG TABLET    Take 1 tablet (40 mg total) by mouth daily.   LEVOTHYROXINE (SYNTHROID, LEVOTHROID) 100 MCG TABLET    Take 1 tablet (100 mcg total) by mouth daily.   MULTIPLE VITAMINS-MINERALS (MULTIVITAMIN WITH MINERALS) TABLET    Take 1 tablet by mouth daily.     OMEPRAZOLE (PRILOSEC) 40 MG CAPSULE       VENLAFAXINE XR (EFFEXOR-XR) 37.5 MG 24 HR CAPSULE    Take 1 capsule (37.5 mg total) by mouth daily with breakfast.   ZOLPIDEM (AMBIEN) 10 MG TABLET    1/2 tab QHS prn insomnia   Modified Medications   No medications on file   New Prescriptions   ALBUTEROL (PROVENTIL HFA;VENTOLIN HFA) 108 (90 BASE) MCG/ACT INHALER    Inhale 2 puffs into the lungs every 6 (six) hours as needed for wheezing or shortness of breath.   OSELTAMIVIR (TAMIFLU) 75 MG CAPSULE    Take 1 capsule (75 mg total) by mouth 2 (two) times daily.   Discontinued Medications   No medications on file   Orders Placed This Encounter  Procedures  . DG Chest 2 View   ASSESSMENT & PLAN: See problem based charting & AVS for pt instructions.

## 2013-09-22 NOTE — Patient Instructions (Signed)
We will call you with the results of the chest x-ray.  Please continue to drink plenty of fluids Take acetaminophen (Tylenol) 1X500mg  or 2X325mg  tablets every 8 hours  Take Ibuprofen (Advil or Motrin) 2-3 200mg  tablets every 8 hours  Try to alternate the acetaminophen and ibuprofen so that you take one every 4 hours Honey has been shown to help with cough.  You can try mixing  a teaspoon of honey in warm water or caffeine free herbal tea before bedtime.

## 2013-09-27 ENCOUNTER — Telehealth: Payer: Self-pay | Admitting: Family Medicine

## 2013-09-27 DIAGNOSIS — J8410 Pulmonary fibrosis, unspecified: Secondary | ICD-10-CM

## 2013-09-27 MED ORDER — GUAIFENESIN-CODEINE 100-10 MG/5ML PO SYRP: 5.0000 mL | ORAL_SOLUTION | Freq: Three times a day (TID) | ORAL | Status: DC | PRN

## 2013-09-27 MED ORDER — AZITHROMYCIN 500 MG PO TABS: 500.0000 mg | ORAL_TABLET | Freq: Every day | ORAL | Status: DC

## 2013-09-27 NOTE — Telephone Encounter (Signed)
So Dr. Paulla Fore for cough and congestion on 3/20.  Need something called in for cough.  Please inform when sent

## 2013-09-27 NOTE — Assessment & Plan Note (Signed)
Called and discussed.  Not as sick but still with wicked cough.  Given chronic underlying disease, will Rx as a bacterial bronchitis and also prescribe cough medicine.  Not short of breath so I doubt she needs steroids.  Will call back in 5 days if not noticeably better.

## 2013-11-08 ENCOUNTER — Other Ambulatory Visit: Payer: Self-pay | Admitting: *Deleted

## 2013-11-08 DIAGNOSIS — G4700 Insomnia, unspecified: Secondary | ICD-10-CM

## 2013-11-08 NOTE — Telephone Encounter (Signed)
Incoming fax from [facility] requesting Ambien refill  Last AEX and refill 05/07/14 #30/5 refills Next appt 04/14/14   Please approve or deny Rx.

## 2013-11-09 MED ORDER — ZOLPIDEM TARTRATE 10 MG PO TABS: ORAL_TABLET | ORAL | Status: DC

## 2013-11-10 NOTE — Telephone Encounter (Signed)
RX faxed

## 2014-02-01 ENCOUNTER — Telehealth: Payer: Self-pay | Admitting: Obstetrics & Gynecology

## 2014-02-01 NOTE — Telephone Encounter (Signed)
Patient calling re: letter from drug company about darifenacin (ENABLEX) 15 MG 24 hr tablet  Take 1 tablet (15 mg total) by mouth daily., Starting 04/14/2013, Until Discontinued, Normal, Last Dose: Not Recorded. Patient wants to know if there is another one from the list she dropped off "earlier this week" she can try instead since her insurance company will not cover this one. Patient moving out of town on 02/05/14 and is hoping we can expedite this for her.  Maggie Font.

## 2014-02-01 NOTE — Telephone Encounter (Signed)
Dr.Miller, List and patient's chart to your desk for review and advise. Thank you.

## 2014-02-02 MED ORDER — MIRABEGRON ER 25 MG PO TB24: 25.0000 mg | ORAL_TABLET | Freq: Every day | ORAL | Status: DC

## 2014-02-02 NOTE — Telephone Encounter (Signed)
Myrbetriq is on her formulary list.  I love this one.  The side effect to be most aware of is increased risk for elevated BP.  This is rare.  Once she starts, she should check her BP a couple of times to make sure normal.  Doesn't have the dry eyes, dry mouth, or constipation issues like enablex.  Comes in 26 and 50mg .  Will start with 25mg .

## 2014-02-02 NOTE — Telephone Encounter (Signed)
Spoke with patient. Advised of message as seen below from Dr.Miller. Patient is agreeable and verbalizes understanding.  Routing to provider for final review. Patient agreeable to disposition. Will close encounter   

## 2014-02-02 NOTE — Telephone Encounter (Signed)
Left message to call Elishua Radford at [phone]. 

## 2014-02-26 ENCOUNTER — Telehealth: Payer: Self-pay | Admitting: Obstetrics & Gynecology

## 2014-02-26 MED ORDER — DARIFENACIN HYDROBROMIDE ER 15 MG PO TB24: 15.0000 mg | ORAL_TABLET | Freq: Every day | ORAL | Status: DC

## 2014-02-26 NOTE — Telephone Encounter (Signed)
Return call to patient, last AEX 04-2013 with Dr Sabra Heck. Scheduled for 05-07-14. She has moved to [place] but plans to continue care here. States D/C Enablex due to [employer] not covering. Dr Sabra Heck changed her to [facility]. Patient states this is NOT working. Increased incontinence and "no control." Soils clothes even with wearing protection. Urgency several times per hour. Interested in going back to Enablex even if has to pay OOP. Would we change her back? Does not have PCP in [place] yet. Advised we may need to R/O UTI before changing med but will review with Dr Sabra Heck and call her back. [facility] [phone].

## 2014-02-26 NOTE — Telephone Encounter (Signed)
[facility] [place]  Pt can restart but if symptoms continue, needs to go to Urgent Care to r/o UTI.

## 2014-02-26 NOTE — Telephone Encounter (Signed)
Pt is having a terrible time with incontinence and urgency.

## 2014-02-26 NOTE — Telephone Encounter (Signed)
Call to patient to advise, LMTCB.

## 2014-02-27 NOTE — Telephone Encounter (Signed)
Spoke with patient. Advised of message as seen below from Johnston. Patient is agreeable and verbalizes understanding. Patient states that she will find some where local to be seen if she still has symptoms.  Routing to provider for final review. Patient agreeable to disposition. Will close encounter

## 2014-02-27 NOTE — Telephone Encounter (Signed)
Returning a call to Kelly °

## 2014-04-23 ENCOUNTER — Other Ambulatory Visit: Payer: Self-pay | Admitting: *Deleted

## 2014-04-23 MED ORDER — ATORVASTATIN CALCIUM 20 MG PO TABS: 20.0000 mg | ORAL_TABLET | Freq: Every day | ORAL | Status: DC

## 2014-04-23 MED ORDER — LEVOTHYROXINE SODIUM 100 MCG PO TABS: 100.0000 ug | ORAL_TABLET | Freq: Every day | ORAL | Status: DC

## 2014-04-23 NOTE — Addendum Note (Signed)
Addended by: Emelia Salisbury C on: 04/23/2014 12:01 PM   Modules accepted: Orders

## 2014-04-23 NOTE — Telephone Encounter (Signed)
Last AEX, refill and TSH check 04/14/13 #30/13R TSH=1.339 Next appt 05/07/14  Rx sent for 30 days to last until appt.

## 2014-04-27 ENCOUNTER — Telehealth: Payer: Self-pay | Admitting: Obstetrics & Gynecology

## 2014-04-27 NOTE — Telephone Encounter (Signed)
I called patient to reschedule her upcoming aex appointment with Dr.Miller. [place] doctor.

## 2014-04-27 NOTE — Telephone Encounter (Signed)
Thank you. Encounter closed. 

## 2014-05-02 ENCOUNTER — Other Ambulatory Visit: Payer: Self-pay

## 2014-05-02 DIAGNOSIS — G4700 Insomnia, unspecified: Secondary | ICD-10-CM

## 2014-05-02 NOTE — Telephone Encounter (Signed)
Lyman Speller, MD Denyse Dago, CMA            Can you change to 15 x 3 and pend rx and I can sign when back in office.   Thanks.

## 2014-05-02 NOTE — Telephone Encounter (Signed)
Rx has been ordered  Routed to provider for final review

## 2014-05-03 MED ORDER — ZOLPIDEM TARTRATE 10 MG PO TABS: ORAL_TABLET | ORAL | Status: DC

## 2014-05-03 NOTE — Telephone Encounter (Signed)
RX has been faxed.

## 2014-05-07 ENCOUNTER — Ambulatory Visit: Payer: BC Managed Care – PPO | Admitting: Obstetrics & Gynecology

## 2014-05-07 ENCOUNTER — Encounter: Payer: Self-pay | Admitting: Sports Medicine

## 2014-05-20 ENCOUNTER — Other Ambulatory Visit: Payer: Self-pay | Admitting: Obstetrics & Gynecology

## 2014-05-21 NOTE — Telephone Encounter (Signed)
Both rx's filled 04/23/14 by Dr. Sabra Heck #30/0 rfs  Left Message To Call Back to schedule AEX (Patient cancelled 05/07/14 appt with Dr. Sabra Heck)

## 2014-05-24 ENCOUNTER — Other Ambulatory Visit: Payer: Self-pay | Admitting: Obstetrics & Gynecology

## 2014-05-24 NOTE — Telephone Encounter (Signed)
Zeb Comfort at 04/27/2014 3:17 PM     Status: Signed       Expand All Collapse All   I called patient to reschedule her upcoming aex appointment with Dr.Miller. [place] doctor.      Rx refused.

## 2014-07-18 ENCOUNTER — Other Ambulatory Visit: Payer: Self-pay | Admitting: Family Medicine

## 2014-07-18 DIAGNOSIS — K219 Gastro-esophageal reflux disease without esophagitis: Secondary | ICD-10-CM

## 2014-07-19 NOTE — Assessment & Plan Note (Signed)
Refill by e request.

## 2014-11-30 NOTE — Patient Instructions (Signed)
Margaret [name]  11/30/2014   Your procedure is scheduled on:    12/18/2014    Report to [facility] Main  Entrance and follow signs to               [place] at    [place] AM.  Call this number if you have problems the morning of surgery [phone]   Remember: ONLY 1 PERSON MAY GO WITH YOU TO SHORT STAY TO GET  READY MORNING OF [place].  Do not eat food or drink liquids :After Midnight.     Take these medicines the morning of surgery with A SIP OF WATER:   Albuterol Inhaler if needed and bring, pepcid, synthroid, Vesicare                                You may not have any metal on your body including hair pins and              piercings  Do not wear jewelry, make-up, lotions, powders or perfumes, deodorant             Do not wear nail polish.  Do not shave  48 hours prior to surgery.     Do not bring valuables to the hospital. [place].  Contacts, dentures or bridgework may not be worn into surgery.  Leave suitcase in the car. After surgery it may be brought to your room.       Special Instructions: coughing and deep breathing exercises, leg exercises               Please read over the following fact sheets you were given: _____________________________________________________________________             [facility] - Preparing for Surgery Before surgery, you can play an important role.  Because skin is not sterile, your skin needs to be as free of germs as possible.  You can reduce the number of germs on your skin by washing with CHG (chlorahexidine gluconate) soap before surgery.  CHG is an antiseptic cleaner which kills germs and bonds with the skin to continue killing germs even after washing. Please DO NOT use if you have an allergy to CHG or antibacterial soaps.  If your skin becomes reddened/irritated stop using the CHG and inform your nurse when you arrive at Short Stay. Do not shave  (including legs and underarms) for at least 48 hours prior to the first CHG shower.  You may shave your face/neck. Please follow these instructions carefully:  1.  Shower with CHG Soap the night before surgery and the  morning of Surgery.  2.  If you choose to wash your hair, wash your hair first as usual with your  normal  shampoo.  3.  After you shampoo, rinse your hair and body thoroughly to remove the  shampoo.                           4.  Use CHG as you would any other liquid soap.  You can apply chg directly  to the skin and wash  Gently with a scrungie or clean washcloth.  5.  Apply the CHG Soap to your body ONLY FROM THE NECK DOWN.   Do not use on face/ open                           Wound or open sores. Avoid contact with eyes, ears mouth and genitals (private parts).                       Wash face,  Genitals (private parts) with your normal soap.             6.  Wash thoroughly, paying special attention to the area where your surgery  will be performed.  7.  Thoroughly rinse your body with warm water from the neck down.  8.  DO NOT shower/wash with your normal soap after using and rinsing off  the CHG Soap.                9.  Pat yourself dry with a clean towel.            10.  Wear clean pajamas.            11.  Place clean sheets on your bed the night of your first shower and do not  sleep with pets. Day of Surgery : Do not apply any lotions/deodorants the morning of surgery.  Please wear clean clothes to the hospital/surgery center.  FAILURE TO FOLLOW THESE INSTRUCTIONS MAY RESULT IN THE CANCELLATION OF YOUR SURGERY PATIENT SIGNATURE_________________________________  NURSE SIGNATURE__________________________________  ________________________________________________________________________  WHAT IS A BLOOD TRANSFUSION? Blood Transfusion Information  A transfusion is the replacement of blood or some of its parts. Blood is made up of multiple cells which  provide different functions.  Red blood cells carry oxygen and are used for blood loss replacement.  White blood cells fight against infection.  Platelets control bleeding.  Plasma helps clot blood.  Other blood products are available for specialized needs, such as hemophilia or other clotting disorders. BEFORE THE TRANSFUSION  Who gives blood for transfusions?   Healthy volunteers who are fully evaluated to make sure their blood is safe. This is blood bank blood. Transfusion therapy is the safest it has ever been in the practice of medicine. Before blood is taken from a donor, a complete history is taken to make sure that person has no history of diseases nor engages in risky social behavior (examples are intravenous drug use or sexual activity with multiple partners). The donor's travel history is screened to minimize risk of transmitting infections, such as malaria. The donated blood is tested for signs of infectious diseases, such as HIV and hepatitis. The blood is then tested to be sure it is compatible with you in order to minimize the chance of a transfusion reaction. If you or a relative donates blood, this is often done in anticipation of surgery and is not appropriate for emergency situations. It takes many days to process the donated blood. RISKS AND COMPLICATIONS Although transfusion therapy is very safe and saves many lives, the main dangers of transfusion include:  1. Getting an infectious disease. 2. Developing a transfusion reaction. This is an allergic reaction to something in the blood you were given. Every precaution is taken to prevent this. The decision to have a blood transfusion has been considered carefully by your caregiver before blood is given. Blood is not given unless the benefits outweigh  the risks. AFTER THE TRANSFUSION  Right after receiving a blood transfusion, you will usually feel much better and more energetic. This is especially true if your red blood cells  have gotten low (anemic). The transfusion raises the level of the red blood cells which carry oxygen, and this usually causes an energy increase.  The nurse administering the transfusion will monitor you carefully for complications. HOME CARE INSTRUCTIONS  No special instructions are needed after a transfusion. You may find your energy is better. Speak with your caregiver about any limitations on activity for underlying diseases you may have. SEEK MEDICAL CARE IF:   Your condition is not improving after your transfusion.  You develop redness or irritation at the intravenous (IV) site. SEEK IMMEDIATE MEDICAL CARE IF:  Any of the following symptoms occur over the next 12 hours:  Shaking chills.  You have a temperature by mouth above 102 F (38.9 C), not controlled by medicine.  Chest, back, or muscle pain.  People around you feel you are not acting correctly or are confused.  Shortness of breath or difficulty breathing.  Dizziness and fainting.  You get a rash or develop hives.  You have a decrease in urine output.  Your urine turns a dark color or changes to pink, red, or brown. Any of the following symptoms occur over the next 10 days:  You have a temperature by mouth above 102 F (38.9 C), not controlled by medicine.  Shortness of breath.  Weakness after normal activity.  The white part of the eye turns yellow (jaundice).  You have a decrease in the amount of urine or are urinating less often.  Your urine turns a dark color or changes to pink, red, or brown. Document Released: 06/19/2000 Document Revised: 09/14/2011 Document Reviewed: 02/06/2008 ExitCare Patient Information 2014 [place].  _______________________________________________________________________  Incentive Spirometer  An incentive spirometer is a tool that can help keep your lungs clear and active. This tool measures how well you are filling your lungs with each breath. Taking long deep  breaths may help reverse or decrease the chance of developing breathing (pulmonary) problems (especially infection) following:  A long period of time when you are unable to move or be active. BEFORE THE PROCEDURE   If the spirometer includes an indicator to show your best effort, your nurse or respiratory therapist will set it to a desired goal.  If possible, sit up straight or lean slightly forward. Try not to slouch.  Hold the incentive spirometer in an upright position. INSTRUCTIONS FOR USE  3. Sit on the edge of your bed if possible, or sit up as far as you can in bed or on a chair. 4. Hold the incentive spirometer in an upright position. 5. Breathe out normally. 6. Place the mouthpiece in your mouth and seal your lips tightly around it. 7. Breathe in slowly and as deeply as possible, raising the piston or the ball toward the top of the column. 8. Hold your breath for 3-5 seconds or for as long as possible. Allow the piston or ball to fall to the bottom of the column. 9. Remove the mouthpiece from your mouth and breathe out normally. 10. Rest for a few seconds and repeat Steps 1 through 7 at least 10 times every 1-2 hours when you are awake. Take your time and take a few normal breaths between deep breaths. 11. The spirometer may include an indicator to show your best effort. Use the indicator as a goal to work  toward during each repetition. 12. After each set of 10 deep breaths, practice coughing to be sure your lungs are clear. If you have an incision (the cut made at the time of surgery), support your incision when coughing by placing a pillow or rolled up towels firmly against it. Once you are able to get out of bed, walk around indoors and cough well. You may stop using the incentive spirometer when instructed by your caregiver.  RISKS AND COMPLICATIONS  Take your time so you do not get dizzy or light-headed.  If you are in pain, you may need to take or ask for pain medication  before doing incentive spirometry. It is harder to take a deep breath if you are having pain. AFTER USE  Rest and breathe slowly and easily.  It can be helpful to keep track of a log of your progress. Your caregiver can provide you with a simple table to help with this. If you are using the spirometer at home, follow these instructions: [place] IF:   You are having difficultly using the spirometer.  You have trouble using the spirometer as often as instructed.  Your pain medication is not giving enough relief while using the spirometer.  You develop fever of 100.5 F (38.1 C) or higher. SEEK IMMEDIATE MEDICAL CARE IF:   You cough up bloody sputum that had not been present before.  You develop fever of 102 F (38.9 C) or greater.  You develop worsening pain at or near the incision site. MAKE SURE YOU:   Understand these instructions.  Will watch your condition.  Will get help right away if you are not doing well or get worse. Document Released: 11/02/2006 Document Revised: 09/14/2011 Document Reviewed: 01/03/2007 [facility] Patient Information 2014 [place], [place].   ________________________________________________________________________

## 2014-12-05 ENCOUNTER — Encounter (HOSPITAL_COMMUNITY)
Admission: RE | Admit: 2014-12-05 | Discharge: 2014-12-05 | Disposition: A | Discharge status: Home / Self Care | Payer: Medicare Other | Source: Ambulatory Visit | Attending: Orthopedic Surgery | Admitting: Orthopedic Surgery

## 2014-12-05 ENCOUNTER — Encounter (HOSPITAL_COMMUNITY): Payer: Self-pay

## 2014-12-05 DIAGNOSIS — Z01812 Encounter for preprocedural laboratory examination: Secondary | ICD-10-CM | POA: Insufficient documentation

## 2014-12-05 DIAGNOSIS — Z01810 Encounter for preprocedural cardiovascular examination: Secondary | ICD-10-CM | POA: Insufficient documentation

## 2014-12-05 DIAGNOSIS — M1611 Unilateral primary osteoarthritis, right hip: Secondary | ICD-10-CM | POA: Insufficient documentation

## 2014-12-05 HISTORY — DX: Gastro-esophageal reflux disease without esophagitis: K21.9

## 2014-12-05 LAB — URINALYSIS, ROUTINE W REFLEX MICROSCOPIC
BILIRUBIN URINE: NEGATIVE
Glucose, UA: NEGATIVE mg/dL
Hgb urine dipstick: NEGATIVE
KETONES UR: NEGATIVE mg/dL
Nitrite: NEGATIVE
PH: 6.5 (ref 5.0–8.0)
PROTEIN: NEGATIVE mg/dL
SPECIFIC GRAVITY, URINE: 1.006 (ref 1.005–1.030)
Urobilinogen, UA: 0.2 mg/dL (ref 0.0–1.0)

## 2014-12-05 LAB — BASIC METABOLIC PANEL
ANION GAP: 8 (ref 5–15)
BUN: 19 mg/dL (ref 6–20)
CO2: 28 mmol/L (ref 22–32)
Calcium: 9.5 mg/dL (ref 8.9–10.3)
Chloride: 105 mmol/L (ref 101–111)
Creatinine, Ser: 0.62 mg/dL (ref 0.44–1.00)
GFR calc Af Amer: >60 mL/min (ref >60)
GLUCOSE: 97 mg/dL (ref 65–99)
Potassium: 4.1 mmol/L (ref 3.5–5.1)
Sodium: 141 mmol/L (ref 135–145)

## 2014-12-05 LAB — SURGICAL PCR SCREEN
MRSA, PCR: NEGATIVE
Staphylococcus aureus: NEGATIVE

## 2014-12-05 LAB — CBC
HCT: 39.3 % (ref 36.0–46.0)
HEMOGLOBIN: 12.7 g/dL (ref 12.0–15.0)
MCH: 30.2 pg (ref 26.0–34.0)
MCHC: 32.3 g/dL (ref 30.0–36.0)
MCV: 93.6 fL (ref 78.0–100.0)
Platelets: 279 10*3/uL (ref 150–400)
RBC: 4.2 MIL/uL (ref 3.87–5.11)
RDW: 14.5 % (ref 11.5–15.5)
WBC: 6.4 10*3/uL (ref 4.0–10.5)

## 2014-12-05 LAB — APTT: APTT: 29 s (ref 24–37)

## 2014-12-05 LAB — URINE MICROSCOPIC-ADD ON

## 2014-12-05 LAB — PROTIME-INR
INR: 0.98 (ref 0.00–1.49)
PROTHROMBIN TIME: 13.2 s (ref 11.6–15.2)

## 2014-12-05 NOTE — Progress Notes (Signed)
Called and left message for [facility] regarding no clearance on chart from Dr Brynda Rim .  ? If she has updated clearance?  I have faxed a medical release form to Dr Willa Frater office to obtain last OV note and ekg if available.

## 2014-12-05 NOTE — Progress Notes (Signed)
NON clearance note on chart from Dr Brynda Rim.  - awaiting- GI blood workup and blood pressure control.   07/24/2014- [facility] - Dr Truman Hayward LOV note on chart  CT chest- 07/24/14 on chart  PFT results on chart- 07/24/14

## 2014-12-05 NOTE — Progress Notes (Signed)
Patient called and stated she was going to call office of Dr Brynda Rim  And to have clearance for surgery faxed to [phone].  Also she will get GI MD to fax colonoscopy report faxed to [phone] in preparation for surgery for clearance.

## 2014-12-05 NOTE — Progress Notes (Signed)
U/A with micro results done 12/05/14 faxed via EPIC to Dr Alvan Dame.

## 2014-12-06 ENCOUNTER — Encounter (HOSPITAL_COMMUNITY): Payer: Self-pay

## 2014-12-06 NOTE — Progress Notes (Signed)
Final EKG done 12/05/14 in EPIC.

## 2014-12-06 NOTE — Progress Notes (Signed)
REquested by fax most recent colonoscopy report from [facility] .  Have received and placed on chart LOV note from [facility] done 09/2014.

## 2014-12-06 NOTE — Progress Notes (Signed)
Received and placed on chart OV note from Gastroenterology on 10/09/2014.

## 2014-12-07 NOTE — Progress Notes (Signed)
Colonoscopy report from 11/14/2014 placed on chart .
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
## 2014-12-18 ENCOUNTER — Encounter (HOSPITAL_COMMUNITY): Admission: RE | Payer: Self-pay | Source: Ambulatory Visit

## 2014-12-18 ENCOUNTER — Inpatient Hospital Stay (HOSPITAL_COMMUNITY): Admission: RE | Admit: 2014-12-18 | Payer: Medicare Other | Source: Ambulatory Visit | Admitting: Orthopedic Surgery

## 2014-12-18 SURGERY — ARTHROPLASTY, HIP, TOTAL, ANTERIOR APPROACH
Anesthesia: Choice | Site: Hip | Laterality: Right

## 2015-03-04 NOTE — Patient Instructions (Signed)
Margaret [name]  03/04/2015   Your procedure is scheduled on:    03/19/2015    Report to [facility] Main  Entrance take [place]  elevators to 3rd floor to  [place] at    0830 AM.  Call this number if you have problems the morning of surgery [phone]   Remember: ONLY 1 PERSON MAY GO WITH YOU TO SHORT STAY TO GET  READY MORNING OF [place].  Do not eat food or drink liquids :After Midnight.               BRING CPAP MASK AND TUBING   Take these medicines the morning of surgery with A SIP OF WATER:   Albuterol Inhaler if needed and bring, synthroid, Vesicare                                You may not have any metal on your body including hair pins and              piercings  Do not wear jewelry, make-up, lotions, powders or perfumes, deodorant             Do not wear nail polish.  Do not shave  48 hours prior to surgery.                 Do not bring valuables to the hospital. [place].  Contacts, dentures or bridgework may not be worn into surgery.  Leave suitcase in the car. After surgery it may be brought to your room.         Special Instructions: coughing and deep breathing exercises, leg exercises               Please read over the following fact sheets you were given: _____________________________________________________________________             [facility] - Preparing for Surgery Before surgery, you can play an important role.  Because skin is not sterile, your skin needs to be as free of germs as possible.  You can reduce the number of germs on your skin by washing with CHG (chlorahexidine gluconate) soap before surgery.  CHG is an antiseptic cleaner which kills germs and bonds with the skin to continue killing germs even after washing. Please DO NOT use if you have an allergy to CHG or antibacterial soaps.  If your skin becomes reddened/irritated stop using the CHG and inform your  nurse when you arrive at Short Stay. Do not shave (including legs and underarms) for at least 48 hours prior to the first CHG shower.  You may shave your face/neck. Please follow these instructions carefully:  1.  Shower with CHG Soap the night before surgery and the  morning of Surgery.  2.  If you choose to wash your hair, wash your hair first as usual with your  normal  shampoo.  3.  After you shampoo, rinse your hair and body thoroughly to remove the  shampoo.                           4.  Use CHG as you would any other liquid soap.  You can apply chg directly  to the skin and wash                       Gently with a scrungie or clean washcloth.  5.  Apply the CHG Soap to your body ONLY FROM THE NECK DOWN.   Do not use on face/ open                           Wound or open sores. Avoid contact with eyes, ears mouth and genitals (private parts).                       Wash face,  Genitals (private parts) with your normal soap.             6.  Wash thoroughly, paying special attention to the area where your surgery  will be performed.  7.  Thoroughly rinse your body with warm water from the neck down.  8.  DO NOT shower/wash with your normal soap after using and rinsing off  the CHG Soap.                9.  Pat yourself dry with a clean towel.            10.  Wear clean pajamas.            11.  Place clean sheets on your bed the night of your first shower and do not  sleep with pets. Day of Surgery : Do not apply any lotions/deodorants the morning of surgery.  Please wear clean clothes to the hospital/surgery center.  FAILURE TO FOLLOW THESE INSTRUCTIONS MAY RESULT IN THE CANCELLATION OF YOUR SURGERY PATIENT SIGNATURE_________________________________  NURSE SIGNATURE__________________________________  ________________________________________________________________________  WHAT IS A BLOOD TRANSFUSION? Blood Transfusion Information  A transfusion is the replacement of blood or some of its  parts. Blood is made up of multiple cells which provide different functions.  Red blood cells carry oxygen and are used for blood loss replacement.  White blood cells fight against infection.  Platelets control bleeding.  Plasma helps clot blood.  Other blood products are available for specialized needs, such as hemophilia or other clotting disorders. BEFORE THE TRANSFUSION  Who gives blood for transfusions?   Healthy volunteers who are fully evaluated to make sure their blood is safe. This is blood bank blood. Transfusion therapy is the safest it has ever been in the practice of medicine. Before blood is taken from a donor, a complete history is taken to make sure that person has no history of diseases nor engages in risky social behavior (examples are intravenous drug use or sexual activity with multiple partners). The donor's travel history is screened to minimize risk of transmitting infections, such as malaria. The donated blood is tested for signs of infectious diseases, such as HIV and hepatitis. The blood is then tested to be sure it is compatible with you in order to minimize the chance of a transfusion reaction. If you or a relative donates blood, this is often done in anticipation of surgery and is not appropriate for emergency situations. It takes many days to process the donated blood. RISKS AND COMPLICATIONS Although transfusion therapy is very safe and saves many lives, the main dangers of transfusion include:  1. Getting an infectious disease. 2. Developing a transfusion reaction. This is an allergic reaction to something in the blood you were given. Every precaution is taken to prevent  this. The decision to have a blood transfusion has been considered carefully by your caregiver before blood is given. Blood is not given unless the benefits outweigh the risks. AFTER THE TRANSFUSION  Right after receiving a blood transfusion, you will usually feel much better and more energetic.  This is especially true if your red blood cells have gotten low (anemic). The transfusion raises the level of the red blood cells which carry oxygen, and this usually causes an energy increase.  The nurse administering the transfusion will monitor you carefully for complications. HOME CARE INSTRUCTIONS  No special instructions are needed after a transfusion. You may find your energy is better. Speak with your caregiver about any limitations on activity for underlying diseases you may have. SEEK MEDICAL CARE IF:   Your condition is not improving after your transfusion.  You develop redness or irritation at the intravenous (IV) site. SEEK IMMEDIATE MEDICAL CARE IF:  Any of the following symptoms occur over the next 12 hours:  Shaking chills.  You have a temperature by mouth above 102 F (38.9 C), not controlled by medicine.  Chest, back, or muscle pain.  People around you feel you are not acting correctly or are confused.  Shortness of breath or difficulty breathing.  Dizziness and fainting.  You get a rash or develop hives.  You have a decrease in urine output.  Your urine turns a dark color or changes to pink, red, or brown. Any of the following symptoms occur over the next 10 days:  You have a temperature by mouth above 102 F (38.9 C), not controlled by medicine.  Shortness of breath.  Weakness after normal activity.  The white part of the eye turns yellow (jaundice).  You have a decrease in the amount of urine or are urinating less often.  Your urine turns a dark color or changes to pink, red, or brown. Document Released: 06/19/2000 Document Revised: 09/14/2011 Document Reviewed: 02/06/2008 ExitCare Patient Information 2014 [place].  _______________________________________________________________________  Incentive Spirometer  An incentive spirometer is a tool that can help keep your lungs clear and active. This tool measures how well you are filling  your lungs with each breath. Taking long deep breaths may help reverse or decrease the chance of developing breathing (pulmonary) problems (especially infection) following:  A long period of time when you are unable to move or be active. BEFORE THE PROCEDURE   If the spirometer includes an indicator to show your best effort, your nurse or respiratory therapist will set it to a desired goal.  If possible, sit up straight or lean slightly forward. Try not to slouch.  Hold the incentive spirometer in an upright position. INSTRUCTIONS FOR USE  3. Sit on the edge of your bed if possible, or sit up as far as you can in bed or on a chair. 4. Hold the incentive spirometer in an upright position. 5. Breathe out normally. 6. Place the mouthpiece in your mouth and seal your lips tightly around it. 7. Breathe in slowly and as deeply as possible, raising the piston or the ball toward the top of the column. 8. Hold your breath for 3-5 seconds or for as long as possible. Allow the piston or ball to fall to the bottom of the column. 9. Remove the mouthpiece from your mouth and breathe out normally. 10. Rest for a few seconds and repeat Steps 1 through 7 at least 10 times every 1-2 hours when you are awake. Take your time and take  a few normal breaths between deep breaths. 11. The spirometer may include an indicator to show your best effort. Use the indicator as a goal to work toward during each repetition. 12. After each set of 10 deep breaths, practice coughing to be sure your lungs are clear. If you have an incision (the cut made at the time of surgery), support your incision when coughing by placing a pillow or rolled up towels firmly against it. Once you are able to get out of bed, walk around indoors and cough well. You may stop using the incentive spirometer when instructed by your caregiver.  RISKS AND COMPLICATIONS  Take your time so you do not get dizzy or light-headed.  If you are in pain, you  may need to take or ask for pain medication before doing incentive spirometry. It is harder to take a deep breath if you are having pain. AFTER USE  Rest and breathe slowly and easily.  It can be helpful to keep track of a log of your progress. Your caregiver can provide you with a simple table to help with this. If you are using the spirometer at home, follow these instructions: [place] IF:   You are having difficultly using the spirometer.  You have trouble using the spirometer as often as instructed.  Your pain medication is not giving enough relief while using the spirometer.  You develop fever of 100.5 F (38.1 C) or higher. SEEK IMMEDIATE MEDICAL CARE IF:   You cough up bloody sputum that had not been present before.  You develop fever of 102 F (38.9 C) or greater.  You develop worsening pain at or near the incision site. MAKE SURE YOU:   Understand these instructions.  Will watch your condition.  Will get help right away if you are not doing well or get worse. Document Released: 11/02/2006 Document Revised: 09/14/2011 Document Reviewed: 01/03/2007 [facility] Patient Information 2014 [place], [place].   ________________________________________________________________________

## 2015-03-06 ENCOUNTER — Encounter (HOSPITAL_COMMUNITY)
Admission: RE | Admit: 2015-03-06 | Discharge: 2015-03-06 | Disposition: A | Discharge status: Home / Self Care | Payer: Medicare Other | Source: Ambulatory Visit | Attending: Orthopedic Surgery | Admitting: Orthopedic Surgery

## 2015-03-06 ENCOUNTER — Encounter (HOSPITAL_COMMUNITY): Payer: Self-pay

## 2015-03-06 DIAGNOSIS — R05 Cough: Secondary | ICD-10-CM | POA: Insufficient documentation

## 2015-03-06 DIAGNOSIS — R739 Hyperglycemia, unspecified: Secondary | ICD-10-CM | POA: Diagnosis not present

## 2015-03-06 DIAGNOSIS — K219 Gastro-esophageal reflux disease without esophagitis: Secondary | ICD-10-CM | POA: Insufficient documentation

## 2015-03-06 DIAGNOSIS — Z01812 Encounter for preprocedural laboratory examination: Secondary | ICD-10-CM | POA: Diagnosis present

## 2015-03-06 DIAGNOSIS — E039 Hypothyroidism, unspecified: Secondary | ICD-10-CM | POA: Insufficient documentation

## 2015-03-06 DIAGNOSIS — C4492 Squamous cell carcinoma of skin, unspecified: Secondary | ICD-10-CM | POA: Insufficient documentation

## 2015-03-06 DIAGNOSIS — I10 Essential (primary) hypertension: Secondary | ICD-10-CM | POA: Diagnosis not present

## 2015-03-06 DIAGNOSIS — G4733 Obstructive sleep apnea (adult) (pediatric): Secondary | ICD-10-CM | POA: Insufficient documentation

## 2015-03-06 DIAGNOSIS — M8580 Other specified disorders of bone density and structure, unspecified site: Secondary | ICD-10-CM | POA: Insufficient documentation

## 2015-03-06 DIAGNOSIS — Z853 Personal history of malignant neoplasm of breast: Secondary | ICD-10-CM | POA: Diagnosis not present

## 2015-03-06 DIAGNOSIS — K117 Disturbances of salivary secretion: Secondary | ICD-10-CM | POA: Diagnosis not present

## 2015-03-06 DIAGNOSIS — E785 Hyperlipidemia, unspecified: Secondary | ICD-10-CM | POA: Diagnosis not present

## 2015-03-06 HISTORY — DX: Pneumonia, unspecified organism: J18.9

## 2015-03-06 HISTORY — DX: Frequency of micturition: R35.0

## 2015-03-06 HISTORY — DX: Essential (primary) hypertension: I10

## 2015-03-06 LAB — CBC
HCT: 33.6 % — ABNORMAL LOW (ref 36.0–46.0)
HEMOGLOBIN: 11.2 g/dL — AB (ref 12.0–15.0)
MCH: 30.9 pg (ref 26.0–34.0)
MCHC: 33.3 g/dL (ref 30.0–36.0)
MCV: 92.6 fL (ref 78.0–100.0)
Platelets: 298 10*3/uL (ref 150–400)
RBC: 3.63 MIL/uL — AB (ref 3.87–5.11)
RDW: 14.1 % (ref 11.5–15.5)
WBC: 8.9 10*3/uL (ref 4.0–10.5)

## 2015-03-06 LAB — URINALYSIS, ROUTINE W REFLEX MICROSCOPIC
Bilirubin Urine: NEGATIVE
GLUCOSE, UA: NEGATIVE mg/dL
Hgb urine dipstick: NEGATIVE
Ketones, ur: NEGATIVE mg/dL
Nitrite: NEGATIVE
PH: 5 (ref 5.0–8.0)
Protein, ur: NEGATIVE mg/dL
Specific Gravity, Urine: 1.018 (ref 1.005–1.030)
Urobilinogen, UA: 0.2 mg/dL (ref 0.0–1.0)

## 2015-03-06 LAB — SURGICAL PCR SCREEN
MRSA, PCR: NEGATIVE
Staphylococcus aureus: NEGATIVE

## 2015-03-06 LAB — BASIC METABOLIC PANEL
ANION GAP: 6 (ref 5–15)
BUN: 25 mg/dL — ABNORMAL HIGH (ref 6–20)
CALCIUM: 8.9 mg/dL (ref 8.9–10.3)
CO2: 28 mmol/L (ref 22–32)
Chloride: 102 mmol/L (ref 101–111)
Creatinine, Ser: 1.06 mg/dL — ABNORMAL HIGH (ref 0.44–1.00)
GFR, EST AFRICAN AMERICAN: 58 mL/min — AB (ref >60)
GFR, EST NON AFRICAN AMERICAN: 50 mL/min — AB (ref >60)
Glucose, Bld: 125 mg/dL — ABNORMAL HIGH (ref 65–99)
Potassium: 3.9 mmol/L (ref 3.5–5.1)
SODIUM: 136 mmol/L (ref 135–145)

## 2015-03-06 LAB — APTT: APTT: 29 s (ref 24–37)

## 2015-03-06 LAB — TYPE AND SCREEN
ABO/RH(D): O POS
Antibody Screen: NEGATIVE

## 2015-03-06 LAB — URINE MICROSCOPIC-ADD ON

## 2015-03-06 LAB — PROTIME-INR
INR: 0.98 (ref 0.00–1.49)
PROTHROMBIN TIME: 13.2 s (ref 11.6–15.2)

## 2015-03-06 NOTE — Progress Notes (Signed)
Clearance- Dr Brynda Rim- 01/18/15 on chart  EKG- 12/05/14- EPIC

## 2015-03-06 NOTE — Progress Notes (Signed)
BMp results done 03/06/2015 faxed via EPIC to Dr Alvan Dame.

## 2015-03-06 NOTE — Progress Notes (Signed)
U/a and micro results done 03/06/2015 faxed via EPIC to Dr Alvan Dame.
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
## 2015-03-19 ENCOUNTER — Encounter (HOSPITAL_COMMUNITY): Payer: Self-pay | Admitting: *Deleted

## 2015-03-19 ENCOUNTER — Inpatient Hospital Stay (HOSPITAL_COMMUNITY): Payer: Medicare Other | Admitting: Certified Registered Nurse Anesthetist

## 2015-03-19 ENCOUNTER — Inpatient Hospital Stay (HOSPITAL_COMMUNITY): Payer: Medicare Other

## 2015-03-19 ENCOUNTER — Encounter (HOSPITAL_COMMUNITY)
Admission: RE | Disposition: A | Discharge status: Skilled Nursing Facility | Payer: Self-pay | Source: Ambulatory Visit | Attending: Orthopedic Surgery

## 2015-03-19 ENCOUNTER — Inpatient Hospital Stay (HOSPITAL_COMMUNITY)
Admission: RE | Admit: 2015-03-19 | Discharge: 2015-03-21 | DRG: 470 | Disposition: A | Discharge status: Skilled Nursing Facility | Payer: Medicare Other | Source: Ambulatory Visit | Attending: Orthopedic Surgery | Admitting: Orthopedic Surgery

## 2015-03-19 DIAGNOSIS — K5900 Constipation, unspecified: Secondary | ICD-10-CM | POA: Diagnosis not present

## 2015-03-19 DIAGNOSIS — Z96642 Presence of left artificial hip joint: Secondary | ICD-10-CM | POA: Diagnosis present

## 2015-03-19 DIAGNOSIS — I10 Essential (primary) hypertension: Secondary | ICD-10-CM | POA: Diagnosis present

## 2015-03-19 DIAGNOSIS — Z96649 Presence of unspecified artificial hip joint: Secondary | ICD-10-CM

## 2015-03-19 DIAGNOSIS — E785 Hyperlipidemia, unspecified: Secondary | ICD-10-CM | POA: Diagnosis not present

## 2015-03-19 DIAGNOSIS — K219 Gastro-esophageal reflux disease without esophagitis: Secondary | ICD-10-CM | POA: Diagnosis present

## 2015-03-19 DIAGNOSIS — E663 Overweight: Secondary | ICD-10-CM | POA: Diagnosis present

## 2015-03-19 DIAGNOSIS — M1611 Unilateral primary osteoarthritis, right hip: Principal | ICD-10-CM | POA: Diagnosis present

## 2015-03-19 DIAGNOSIS — M8580 Other specified disorders of bone density and structure, unspecified site: Secondary | ICD-10-CM | POA: Diagnosis present

## 2015-03-19 DIAGNOSIS — Z01812 Encounter for preprocedural laboratory examination: Secondary | ICD-10-CM | POA: Diagnosis not present

## 2015-03-19 DIAGNOSIS — M25551 Pain in right hip: Secondary | ICD-10-CM | POA: Diagnosis present

## 2015-03-19 DIAGNOSIS — E039 Hypothyroidism, unspecified: Secondary | ICD-10-CM | POA: Diagnosis present

## 2015-03-19 DIAGNOSIS — Z6828 Body mass index (BMI) 28.0-28.9, adult: Secondary | ICD-10-CM | POA: Diagnosis not present

## 2015-03-19 HISTORY — PX: TOTAL HIP ARTHROPLASTY: SHX124

## 2015-03-19 SURGERY — ARTHROPLASTY, HIP, TOTAL, ANTERIOR APPROACH
Anesthesia: Spinal | Site: Hip | Laterality: Right

## 2015-03-19 MED ORDER — ATORVASTATIN CALCIUM 20 MG PO TABS
20.0000 mg | ORAL_TABLET | Freq: Every evening | ORAL | Status: DC
  Administered 2015-03-19 – 2015-03-20 (×2): 20 mg via ORAL
  Filled 2015-03-19 (×3): qty 1

## 2015-03-19 MED ORDER — TRANEXAMIC ACID 1000 MG/10ML IV SOLN
1000.0000 mg | Freq: Once | INTRAVENOUS | Status: AC
  Administered 2015-03-19: 1000 mg via INTRAVENOUS
  Filled 2015-03-19 (×2): qty 10

## 2015-03-19 MED ORDER — FAMOTIDINE 40 MG PO TABS
40.0000 mg | ORAL_TABLET | Freq: Every day | ORAL | Status: DC
  Administered 2015-03-20 – 2015-03-21 (×2): 40 mg via ORAL
  Filled 2015-03-19 (×2): qty 1

## 2015-03-19 MED ORDER — PROMETHAZINE HCL 25 MG/ML IJ SOLN: 6.2500 mg | INTRAMUSCULAR | Status: DC | PRN

## 2015-03-19 MED ORDER — MENTHOL 3 MG MT LOZG: 1.0000 | LOZENGE | OROMUCOSAL | Status: DC | PRN

## 2015-03-19 MED ORDER — PHENYLEPHRINE 40 MCG/ML (10ML) SYRINGE FOR IV PUSH (FOR BLOOD PRESSURE SUPPORT)
PREFILLED_SYRINGE | INTRAVENOUS | Status: AC
  Filled 2015-03-19: qty 10

## 2015-03-19 MED ORDER — FERROUS SULFATE 325 (65 FE) MG PO TABS
325.0000 mg | ORAL_TABLET | Freq: Three times a day (TID) | ORAL | Status: DC
  Administered 2015-03-19 – 2015-03-21 (×5): 325 mg via ORAL
  Filled 2015-03-19 (×8): qty 1

## 2015-03-19 MED ORDER — PHENYLEPHRINE 40 MCG/ML (10ML) SYRINGE FOR IV PUSH (FOR BLOOD PRESSURE SUPPORT)
PREFILLED_SYRINGE | INTRAVENOUS | Status: AC
  Filled 2015-03-19: qty 20

## 2015-03-19 MED ORDER — CEFAZOLIN SODIUM-DEXTROSE 2-3 GM-% IV SOLR
2.0000 g | Freq: Four times a day (QID) | INTRAVENOUS | Status: AC
  Administered 2015-03-19 (×2): 2 g via INTRAVENOUS
  Filled 2015-03-19 (×2): qty 50

## 2015-03-19 MED ORDER — POLYETHYLENE GLYCOL 3350 17 G PO PACK
17.0000 g | PACK | Freq: Two times a day (BID) | ORAL | Status: DC
  Administered 2015-03-19 – 2015-03-21 (×3): 17 g via ORAL

## 2015-03-19 MED ORDER — PROPOFOL 10 MG/ML IV BOLUS
INTRAVENOUS | Status: AC
  Filled 2015-03-19: qty 20

## 2015-03-19 MED ORDER — METOCLOPRAMIDE HCL 10 MG PO TABS: 5.0000 mg | ORAL_TABLET | Freq: Three times a day (TID) | ORAL | Status: DC | PRN

## 2015-03-19 MED ORDER — SODIUM CHLORIDE 0.9 % IV SOLN
100.0000 mL/h | INTRAVENOUS | Status: AC
  Administered 2015-03-19: 100 mL/h via INTRAVENOUS
  Filled 2015-03-19: qty 1000

## 2015-03-19 MED ORDER — CHLORHEXIDINE GLUCONATE 4 % EX LIQD: 60.0000 mL | Freq: Once | CUTANEOUS | Status: DC

## 2015-03-19 MED ORDER — ONDANSETRON HCL 4 MG/2ML IJ SOLN
INTRAMUSCULAR | Status: AC
Start: 2015-03-19 — End: 2015-03-19
  Filled 2015-03-19: qty 2

## 2015-03-19 MED ORDER — LACTATED RINGERS IV SOLN
INTRAVENOUS | Status: DC | PRN
  Administered 2015-03-19 (×3): via INTRAVENOUS

## 2015-03-19 MED ORDER — PROPOFOL 10 MG/ML IV BOLUS
INTRAVENOUS | Status: AC
Start: 2015-03-19 — End: 2015-03-19
  Filled 2015-03-19: qty 20

## 2015-03-19 MED ORDER — CEFAZOLIN SODIUM-DEXTROSE 2-3 GM-% IV SOLR
INTRAVENOUS | Status: AC
  Filled 2015-03-19: qty 50

## 2015-03-19 MED ORDER — METOCLOPRAMIDE HCL 5 MG/ML IJ SOLN: 5.0000 mg | Freq: Three times a day (TID) | INTRAMUSCULAR | Status: DC | PRN

## 2015-03-19 MED ORDER — DIPHENHYDRAMINE HCL 25 MG PO CAPS: 25.0000 mg | ORAL_CAPSULE | Freq: Four times a day (QID) | ORAL | Status: DC | PRN

## 2015-03-19 MED ORDER — AMLODIPINE BESYLATE 10 MG PO TABS
10.0000 mg | ORAL_TABLET | Freq: Once | ORAL | Status: AC
  Administered 2015-03-19: 10 mg via ORAL
  Filled 2015-03-19: qty 1

## 2015-03-19 MED ORDER — IRBESARTAN 300 MG PO TABS
300.0000 mg | ORAL_TABLET | Freq: Every day | ORAL | Status: DC
  Administered 2015-03-20 – 2015-03-21 (×2): 300 mg via ORAL
  Filled 2015-03-19 (×2): qty 1

## 2015-03-19 MED ORDER — DARIFENACIN HYDROBROMIDE ER 15 MG PO TB24
15.0000 mg | ORAL_TABLET | Freq: Every day | ORAL | Status: DC
  Administered 2015-03-20 – 2015-03-21 (×2): 15 mg via ORAL
  Filled 2015-03-19 (×2): qty 1

## 2015-03-19 MED ORDER — FENTANYL CITRATE (PF) 100 MCG/2ML IJ SOLN
INTRAMUSCULAR | Status: AC
  Filled 2015-03-19: qty 4

## 2015-03-19 MED ORDER — ZOLPIDEM TARTRATE 5 MG PO TABS
5.0000 mg | ORAL_TABLET | Freq: Every day | ORAL | Status: DC
  Administered 2015-03-19 – 2015-03-20 (×2): 5 mg via ORAL
  Filled 2015-03-19 (×2): qty 1

## 2015-03-19 MED ORDER — SODIUM CHLORIDE 0.9 % IR SOLN
Status: DC | PRN
  Administered 2015-03-19: 1000 mL

## 2015-03-19 MED ORDER — BUPIVACAINE IN DEXTROSE 0.75-8.25 % IT SOLN
INTRATHECAL | Status: DC | PRN
  Administered 2015-03-19: 1.6 mL via INTRATHECAL

## 2015-03-19 MED ORDER — PHENYLEPHRINE HCL 10 MG/ML IJ SOLN
INTRAMUSCULAR | Status: DC | PRN
  Administered 2015-03-19: 80 ug via INTRAVENOUS
  Administered 2015-03-19: 40 ug via INTRAVENOUS
  Administered 2015-03-19: 80 ug via INTRAVENOUS
  Administered 2015-03-19: 40 ug via INTRAVENOUS
  Administered 2015-03-19 (×2): 80 ug via INTRAVENOUS
  Administered 2015-03-19: 40 ug via INTRAVENOUS
  Administered 2015-03-19 (×4): 80 ug via INTRAVENOUS
  Administered 2015-03-19: 40 ug via INTRAVENOUS
  Administered 2015-03-19: 80 ug via INTRAVENOUS

## 2015-03-19 MED ORDER — HYDROMORPHONE HCL 1 MG/ML IJ SOLN
0.5000 mg | INTRAMUSCULAR | Status: DC | PRN
  Administered 2015-03-19: 0.5 mg via INTRAVENOUS
  Filled 2015-03-19: qty 1

## 2015-03-19 MED ORDER — HYDROMORPHONE HCL 1 MG/ML IJ SOLN
0.2500 mg | INTRAMUSCULAR | Status: DC | PRN
  Administered 2015-03-19 (×2): 0.5 mg via INTRAVENOUS

## 2015-03-19 MED ORDER — ONDANSETRON HCL 4 MG PO TABS: 4.0000 mg | ORAL_TABLET | Freq: Four times a day (QID) | ORAL | Status: DC | PRN

## 2015-03-19 MED ORDER — CEFAZOLIN SODIUM-DEXTROSE 2-3 GM-% IV SOLR
2.0000 g | INTRAVENOUS | Status: AC
  Administered 2015-03-19: 2 g via INTRAVENOUS

## 2015-03-19 MED ORDER — PROPOFOL INFUSION 10 MG/ML OPTIME
INTRAVENOUS | Status: DC | PRN
  Administered 2015-03-19: 50 ug/kg/min via INTRAVENOUS

## 2015-03-19 MED ORDER — ASPIRIN EC 325 MG PO TBEC
325.0000 mg | DELAYED_RELEASE_TABLET | Freq: Two times a day (BID) | ORAL | Status: DC
  Administered 2015-03-20 – 2015-03-21 (×3): 325 mg via ORAL
  Filled 2015-03-19 (×5): qty 1

## 2015-03-19 MED ORDER — MIDAZOLAM HCL 5 MG/5ML IJ SOLN
INTRAMUSCULAR | Status: DC | PRN
  Administered 2015-03-19: 2 mg via INTRAVENOUS

## 2015-03-19 MED ORDER — HYDROCODONE-ACETAMINOPHEN 7.5-325 MG PO TABS
1.0000 | ORAL_TABLET | ORAL | Status: DC
  Administered 2015-03-19 (×2): 2 via ORAL
  Administered 2015-03-20: 1 via ORAL
  Administered 2015-03-20: 2 via ORAL
  Administered 2015-03-20 – 2015-03-21 (×7): 1 via ORAL
  Filled 2015-03-19: qty 1
  Filled 2015-03-19: qty 2
  Filled 2015-03-19: qty 1
  Filled 2015-03-19: qty 2
  Filled 2015-03-19 (×6): qty 1
  Filled 2015-03-19: qty 2
  Filled 2015-03-19: qty 1

## 2015-03-19 MED ORDER — METHOCARBAMOL 500 MG PO TABS
500.0000 mg | ORAL_TABLET | Freq: Four times a day (QID) | ORAL | Status: DC | PRN
  Administered 2015-03-20 (×2): 500 mg via ORAL
  Filled 2015-03-19 (×2): qty 1

## 2015-03-19 MED ORDER — FENTANYL CITRATE (PF) 100 MCG/2ML IJ SOLN
INTRAMUSCULAR | Status: DC | PRN
  Administered 2015-03-19 (×2): 50 ug via INTRAVENOUS

## 2015-03-19 MED ORDER — HYDROCHLOROTHIAZIDE 12.5 MG PO CAPS
12.5000 mg | ORAL_CAPSULE | Freq: Every day | ORAL | Status: DC
  Administered 2015-03-20 – 2015-03-21 (×2): 12.5 mg via ORAL
  Filled 2015-03-19 (×2): qty 1

## 2015-03-19 MED ORDER — ALBUTEROL SULFATE (2.5 MG/3ML) 0.083% IN NEBU: 2.5000 mg | INHALATION_SOLUTION | Freq: Four times a day (QID) | RESPIRATORY_TRACT | Status: DC | PRN

## 2015-03-19 MED ORDER — PHENOL 1.4 % MT LIQD: 1.0000 | OROMUCOSAL | Status: DC | PRN

## 2015-03-19 MED ORDER — CETYLPYRIDINIUM CHLORIDE 0.05 % MT LIQD
7.0000 mL | Freq: Two times a day (BID) | OROMUCOSAL | Status: DC
  Administered 2015-03-20: 7 mL via OROMUCOSAL

## 2015-03-19 MED ORDER — HYDROMORPHONE HCL 1 MG/ML IJ SOLN
INTRAMUSCULAR | Status: AC
  Filled 2015-03-19: qty 1

## 2015-03-19 MED ORDER — PROPOFOL 10 MG/ML IV BOLUS
INTRAVENOUS | Status: DC | PRN
  Administered 2015-03-19 (×2): 10 mg via INTRAVENOUS

## 2015-03-19 MED ORDER — DOCUSATE SODIUM 100 MG PO CAPS
100.0000 mg | ORAL_CAPSULE | Freq: Two times a day (BID) | ORAL | Status: DC
  Administered 2015-03-19 – 2015-03-21 (×4): 100 mg via ORAL

## 2015-03-19 MED ORDER — METHOCARBAMOL 1000 MG/10ML IJ SOLN
500.0000 mg | Freq: Four times a day (QID) | INTRAVENOUS | Status: DC | PRN
  Administered 2015-03-19: 500 mg via INTRAVENOUS
  Filled 2015-03-19 (×2): qty 5

## 2015-03-19 MED ORDER — ONDANSETRON HCL 4 MG/2ML IJ SOLN: 4.0000 mg | Freq: Four times a day (QID) | INTRAMUSCULAR | Status: DC | PRN

## 2015-03-19 MED ORDER — MAGNESIUM CITRATE PO SOLN: 1.0000 | Freq: Once | ORAL | Status: DC | PRN

## 2015-03-19 MED ORDER — BISACODYL 10 MG RE SUPP: 10.0000 mg | Freq: Every day | RECTAL | Status: DC | PRN

## 2015-03-19 MED ORDER — ALUM & MAG HYDROXIDE-SIMETH 200-200-20 MG/5ML PO SUSP: 30.0000 mL | ORAL | Status: DC | PRN

## 2015-03-19 MED ORDER — DEXAMETHASONE SODIUM PHOSPHATE 10 MG/ML IJ SOLN
10.0000 mg | Freq: Once | INTRAMUSCULAR | Status: DC
  Filled 2015-03-19: qty 1

## 2015-03-19 MED ORDER — MIDAZOLAM HCL 2 MG/2ML IJ SOLN
INTRAMUSCULAR | Status: AC
  Filled 2015-03-19: qty 4

## 2015-03-19 MED ORDER — OLMESARTAN-AMLODIPINE-HCTZ 40-10-12.5 MG PO TABS: 1.0000 | ORAL_TABLET | Freq: Every morning | ORAL | Status: DC

## 2015-03-19 MED ORDER — CELECOXIB 200 MG PO CAPS
200.0000 mg | ORAL_CAPSULE | Freq: Two times a day (BID) | ORAL | Status: DC
  Administered 2015-03-19 – 2015-03-21 (×4): 200 mg via ORAL
  Filled 2015-03-19 (×6): qty 1

## 2015-03-19 MED ORDER — CHLORHEXIDINE GLUCONATE 0.12 % MT SOLN
15.0000 mL | Freq: Two times a day (BID) | OROMUCOSAL | Status: DC
  Administered 2015-03-20 – 2015-03-21 (×3): 15 mL via OROMUCOSAL
  Filled 2015-03-19 (×4): qty 15

## 2015-03-19 MED ORDER — LEVOTHYROXINE SODIUM 100 MCG PO TABS
100.0000 ug | ORAL_TABLET | Freq: Every day | ORAL | Status: DC
  Administered 2015-03-20 – 2015-03-21 (×2): 100 ug via ORAL
  Filled 2015-03-19 (×3): qty 1

## 2015-03-19 MED ORDER — DEXAMETHASONE SODIUM PHOSPHATE 10 MG/ML IJ SOLN
10.0000 mg | Freq: Once | INTRAMUSCULAR | Status: AC
  Administered 2015-03-19: 10 mg via INTRAVENOUS

## 2015-03-19 MED ORDER — ONDANSETRON HCL 4 MG/2ML IJ SOLN
INTRAMUSCULAR | Status: DC | PRN
  Administered 2015-03-19: 4 mg via INTRAVENOUS

## 2015-03-19 MED ORDER — LACTATED RINGERS IV SOLN: INTRAVENOUS | Status: DC

## 2015-03-19 SURGICAL SUPPLY — 44 items
BAG DECANTER FOR FLEXI CONT (MISCELLANEOUS) IMPLANT
BAG SPEC THK2 15X12 ZIP CLS (MISCELLANEOUS) ×1
BAG ZIPLOCK 12X15 (MISCELLANEOUS) ×2 IMPLANT
CAPT HIP TOTAL 2 ×3 IMPLANT
COVER PERINEAL POST (MISCELLANEOUS) ×3 IMPLANT
DRAPE C-ARM 42X120 X-RAY (DRAPES) ×3 IMPLANT
DRAPE STERI IOBAN 125X83 (DRAPES) ×3 IMPLANT
DRAPE U-SHAPE 47X51 STRL (DRAPES) ×9 IMPLANT
DRSG AQUACEL AG ADV 3.5X10 (GAUZE/BANDAGES/DRESSINGS) ×3 IMPLANT
DURAPREP 26ML APPLICATOR (WOUND CARE) ×3 IMPLANT
ELECT BLADE TIP CTD 4 INCH (ELECTRODE) ×3 IMPLANT
ELECT PENCIL ROCKER SW 15FT (MISCELLANEOUS) IMPLANT
ELECT REM PT RETURN 15FT ADLT (MISCELLANEOUS) IMPLANT
ELECT REM PT RETURN 9FT ADLT (ELECTROSURGICAL) ×3
ELECTRODE REM PT RTRN 9FT ADLT (ELECTROSURGICAL) ×1 IMPLANT
FACESHIELD WRAPAROUND (MASK) ×12 IMPLANT
GLOVE BIOGEL PI IND STRL 7.5 (GLOVE) ×1 IMPLANT
GLOVE BIOGEL PI IND STRL 8.5 (GLOVE) ×1 IMPLANT
GLOVE BIOGEL PI INDICATOR 7.5 (GLOVE) ×2
GLOVE BIOGEL PI INDICATOR 8.5 (GLOVE) ×2
GLOVE ECLIPSE 8.0 STRL XLNG CF (GLOVE) ×6 IMPLANT
GLOVE ORTHO TXT STRL SZ7.5 (GLOVE) ×3 IMPLANT
GOWN SPEC L3 XXLG W/TWL (GOWN DISPOSABLE) ×3 IMPLANT
GOWN STRL REUS W/TWL LRG LVL3 (GOWN DISPOSABLE) ×3 IMPLANT
HOLDER FOLEY CATH W/STRAP (MISCELLANEOUS) ×3 IMPLANT
KIT BASIN OR (CUSTOM PROCEDURE TRAY) ×3 IMPLANT
LIQUID BAND (GAUZE/BANDAGES/DRESSINGS) ×3 IMPLANT
NDL SAFETY ECLIPSE 18X1.5 (NEEDLE) IMPLANT
NEEDLE HYPO 18GX1.5 SHARP (NEEDLE)
PACK TOTAL JOINT (CUSTOM PROCEDURE TRAY) ×3 IMPLANT
PEN SKIN MARKING BROAD (MISCELLANEOUS) ×3 IMPLANT
SAW OSC TIP CART 19.5X105X1.3 (SAW) ×3 IMPLANT
SUT MNCRL AB 4-0 PS2 18 (SUTURE) ×3 IMPLANT
SUT VIC AB 1 CT1 36 (SUTURE) ×9 IMPLANT
SUT VIC AB 2-0 CT1 27 (SUTURE) ×6
SUT VIC AB 2-0 CT1 TAPERPNT 27 (SUTURE) ×2 IMPLANT
SUT VLOC 180 0 24IN GS25 (SUTURE) ×3 IMPLANT
SYR 50ML LL SCALE MARK (SYRINGE) IMPLANT
TOWEL OR 17X26 10 PK STRL BLUE (TOWEL DISPOSABLE) ×3 IMPLANT
TOWEL OR NON WOVEN STRL DISP B (DISPOSABLE) ×3 IMPLANT
TRAY FOLEY W/METER SILVER 14FR (SET/KITS/TRAYS/PACK) ×3 IMPLANT
TRAY FOLEY W/METER SILVER 16FR (SET/KITS/TRAYS/PACK) IMPLANT
WATER STERILE IRR 1500ML POUR (IV SOLUTION) ×3 IMPLANT
YANKAUER SUCT BULB TIP 10FT TU (MISCELLANEOUS) ×3 IMPLANT

## 2015-03-19 NOTE — Anesthesia Procedure Notes (Addendum)
Spinal Patient location during procedure: OR Start time: 03/19/2015 11:13 AM End time: 03/19/2015 11:17 AM Staffing Anesthesiologist: Rica Koyanagi Resident/CRNA: Darlys Gales R Performed by: resident/CRNA  Preanesthetic Checklist Completed: patient identified, site marked, surgical consent, pre-op evaluation, timeout performed, IV checked, risks and benefits discussed and monitors and equipment checked Spinal Block Patient position: sitting Prep: Betadine Patient monitoring: heart rate, continuous pulse ox and blood pressure Location: L3-4 Injection technique: single-shot Needle Needle type: Spinocan  Needle gauge: 22 G Needle length: 9 cm Needle insertion depth: 7 cm Assessment Sensory level: T6 Additional Notes Expiration date of kit checked and confirmed. Patient tolerated procedure well, without complications. Lot [id number]

## 2015-03-19 NOTE — Op Note (Signed)
NAME:  Margaret [name]                ACCOUNT NO.: [id number]      MEDICAL RECORD NO.: [id number]      FACILITY:  [facility]      PHYSICIAN:  Paralee Cancel D  DATE OF BIRTH:  06/25/1941     DATE OF PROCEDURE:  03/19/2015                                 OPERATIVE REPORT         PREOPERATIVE DIAGNOSIS: Right  hip osteoarthritis.      POSTOPERATIVE DIAGNOSIS:  Right hip osteoarthritis.      PROCEDURE:  Right total hip replacement through an anterior approach   utilizing DePuy THR system, component size 93mm pinnacle cup, a size 36+4 neutral   Altrex liner, a size 6 Hi Tri Lock stem with a 36+1.5 delta ceramic   ball.      SURGEON:  Pietro Cassis. Alvan Dame, M.D.      ASSISTANT:  Nehemiah Massed, PA-C     ANESTHESIA:  Spinal.      SPECIMENS:  None.      COMPLICATIONS:  None.      BLOOD LOSS:  300 cc     DRAINS:  None.      INDICATION OF THE PROCEDURE:  Margaret [name] is a 74 y.o. female who had   presented to office for evaluation of right hip pain.  Radiographs revealed   progressive degenerative changes with bone-on-bone   articulation to the  hip joint.  The patient had painful limited range of   motion significantly affecting their overall quality of life.  The patient was failing to    respond to conservative measures, and at this point was ready   to proceed with more definitive measures.  The patient has noted progressive   degenerative changes in his hip, progressive problems and dysfunction   with regarding the hip prior to surgery.  Consent was obtained for   benefit of pain relief.  Specific risk of infection, DVT, component   failure, dislocation, need for revision surgery, as well discussion of   the anterior versus posterior approach were reviewed.  Consent was   obtained for benefit of anterior pain relief through an anterior   approach.      PROCEDURE IN DETAIL:  The patient was brought to operative theater.   Once adequate anesthesia,  preoperative antibiotics, 2gm of Ancef, 1 gm of Tranexamic Acid, and 10 mg of Decadron administered.   The patient was positioned supine on the OSI Hanna table.  Once adequate   padding of boney process was carried out, we had predraped out the hip, and  used fluoroscopy to confirm orientation of the pelvis and position.      The right hip was then prepped and draped from proximal iliac crest to   mid thigh with shower curtain technique.      Time-out was performed identifying the patient, planned procedure, and   extremity.     An incision was then made 2 cm distal and lateral to the   anterior superior iliac spine extending over the orientation of the   tensor fascia lata muscle and sharp dissection was carried down to the   fascia of the muscle and protractor placed in the soft tissues.      The fascia was  then incised.  The muscle belly was identified and swept   laterally and retractor placed along the superior neck.  Following   cauterization of the circumflex vessels and removing some pericapsular   fat, a second cobra retractor was placed on the inferior neck.  A third   retractor was placed on the anterior acetabulum after elevating the   anterior rectus.  A L-capsulotomy was along the line of the   superior neck to the trochanteric fossa, then extended proximally and   distally.  Tag sutures were placed and the retractors were then placed   intracapsular.  We then identified the trochanteric fossa and   orientation of my neck cut, confirmed this radiographically   and then made a neck osteotomy with the femur on traction.  The femoral   head was removed without difficulty or complication.  Traction was let   off and retractors were placed posterior and anterior around the   acetabulum.      The labrum and foveal tissue were debrided.  I began reaming with a 88mm   reamer and reamed up to 72mm reamer with good bony bed preparation and a 20mm   cup was chosen.  The final 10mm  Pinnacle cup was then impacted under fluoroscopy  to confirm the depth of penetration and orientation with respect to   abduction.  A screw was placed followed by the hole eliminator.  The final   36+4 neutral Altrex liner was impacted with good visualized rim fit.  The cup was positioned anatomically within the acetabular portion of the pelvis.      At this point, the femur was rolled at 80 degrees.  Further capsule was   released off the inferior aspect of the femoral neck.  I then   released the superior capsule proximally.  The hook was placed laterally   along the femur and elevated manually and held in position with the bed   hook.  The leg was then extended and adducted with the leg rolled to 100   degrees of external rotation.  Once the proximal femur was fully   exposed, I used a box osteotome to set orientation.  I then began   broaching with the starting chili pepper broach and passed this by hand and then broached up to 6.  With the 6 broach in place I chose a high offset neck and did a trial reduction.  The offset was appropriate, leg lengths   appeared to be equal, confirmed radiographically.   Given these findings, I went ahead and dislocated the hip, repositioned all   retractors and positioned the right hip in the extended and abducted position.  The final 6 Hi Tri Lock stem was   chosen and it was impacted down to the level of neck cut.  Based on this   and the trial reduction, a 36+1.5 delta ceramic ball was chosen and   impacted onto a clean and dry trunnion, and the hip was reduced.  The   hip had been irrigated throughout the case again at this point.  I did   reapproximate the superior capsular leaflet to the anterior leaflet   using #1 Vicryl.  The fascia of the   tensor fascia lata muscle was then reapproximated using #1 Vicryl an #0 V-lock sutures.  The   remaining wound was closed with 2-0 Vicryl and running 4-0 Monocryl.   The hip was cleaned, dried, and dressed  sterilely using Dermabond and  Aquacel dressing.  She was then brought   to recovery room in stable condition tolerating the procedure well.    Nehemiah Massed, PA-C was present for the entirety of the case involved from   preoperative positioning, perioperative retractor management, general   facilitation of the case, as well as primary wound closure as assistant.            Pietro Cassis Alvan Dame, M.D.        03/19/2015 12:38 PM

## 2015-03-19 NOTE — Anesthesia Postprocedure Evaluation (Signed)
  Anesthesia Post-op Note  Patient: Margaret [name]  Procedure(s) Performed: Procedure(s): RIGHT TOTAL HIP ARTHROPLASTY ANTERIOR APPROACH (Right)  Patient Location: PACU  Anesthesia Type:Spinal  Level of Consciousness: awake and alert   Airway and Oxygen Therapy: Patient Spontanous Breathing  Post-op Pain: mild  Post-op Assessment: Post-op Vital signs reviewed LLE Motor Response: Purposeful movement LLE Sensation: Full sensation RLE Motor Response: Purposeful movement RLE Sensation: Full sensation L Sensory Level: S1-Sole of foot, small toes R Sensory Level: S1-Sole of foot, small toes  Post-op Vital Signs: stable  Last Vitals:  Filed Vitals:   03/19/15 1435  BP: 133/59  Pulse: 87  Temp: 36.8 C  Resp: 20    Complications: No apparent anesthesia complications

## 2015-03-19 NOTE — Transfer of Care (Addendum)
Immediate Anesthesia Transfer of Care Note  Patient: Margaret [name]  Procedure(s) Performed: Procedure(s): RIGHT TOTAL HIP ARTHROPLASTY ANTERIOR APPROACH (Right)  Patient Location: PACU  Anesthesia Type:Spinal  Level of Consciousness:  sedated, patient cooperative and responds to stimulation  Airway & Oxygen Therapy:Patient Spontanous Breathing and Patient connected to face mask oxgen  Post-op Assessment:  Report given to PACU RN and Post -op Vital signs reviewed and stable  Post vital signs:  Reviewed and stable, Spinal level S1  Last Vitals:  Filed Vitals:   03/19/15 0829  BP: 129/61  Pulse: 88  Temp: 36.4 C  Resp: 18    Complications: No apparent anesthesia complications,

## 2015-03-19 NOTE — Interval H&P Note (Signed)
History and Physical Interval Note:  03/19/2015 9:56 AM  Margaret [name]  has presented today for surgery, with the diagnosis of RIGHT HIP OA  The various methods of treatment have been discussed with the patient and family. After consideration of risks, benefits and other options for treatment, the patient has consented to  Procedure(s): RIGHT TOTAL HIP ARTHROPLASTY ANTERIOR APPROACH (Right) as a surgical intervention .  The patient's history has been reviewed, patient examined, no change in status, stable for surgery.  I have reviewed the patient's chart and labs.  Questions were answered to the patient's satisfaction.     Mauri Pole

## 2015-03-19 NOTE — Anesthesia Preprocedure Evaluation (Signed)
Anesthesia Evaluation  Patient identified by MRN, date of birth, ID band Patient awake    Reviewed: Allergy & Precautions, NPO status , Patient's Chart, lab work & pertinent test results  History of Anesthesia Complications Negative for: history of anesthetic complications  Airway Mallampati: II  TM Distance: >3 FB Neck ROM: Full    Dental  (+) Teeth Intact   Pulmonary sleep apnea ,    breath sounds clear to auscultation       Cardiovascular hypertension,  Rhythm:Regular Rate:Normal     Neuro/Psych negative neurological ROS     GI/Hepatic Neg liver ROS,   Endo/Other  Hypothyroidism   Renal/GU negative Renal ROS     Musculoskeletal  (+) Arthritis ,   Abdominal   Peds  Hematology negative hematology ROS (+)   Anesthesia Other Findings   Reproductive/Obstetrics                             Anesthesia Physical Anesthesia Plan  ASA: II  Anesthesia Plan: Spinal   Post-op Pain Management:    Induction: Intravenous  Airway Management Planned: Natural Airway and Simple Face Mask  Additional Equipment:   Intra-op Plan:   Post-operative Plan:   Informed Consent: I have reviewed the patients History and Physical, chart, labs and discussed the procedure including the risks, benefits and alternatives for the proposed anesthesia with the patient or authorized representative who has indicated his/her understanding and acceptance.     Plan Discussed with: CRNA and Surgeon  Anesthesia Plan Comments:         Anesthesia Quick Evaluation

## 2015-03-19 NOTE — Progress Notes (Signed)
Utilization review completed.  

## 2015-03-20 ENCOUNTER — Encounter (HOSPITAL_COMMUNITY): Payer: Self-pay | Admitting: Orthopedic Surgery

## 2015-03-20 LAB — CBC
HCT: 28.8 % — ABNORMAL LOW (ref 36.0–46.0)
Hemoglobin: 9.6 g/dL — ABNORMAL LOW (ref 12.0–15.0)
MCH: 31.2 pg (ref 26.0–34.0)
MCHC: 33.3 g/dL (ref 30.0–36.0)
MCV: 93.5 fL (ref 78.0–100.0)
PLATELETS: 238 10*3/uL (ref 150–400)
RBC: 3.08 MIL/uL — ABNORMAL LOW (ref 3.87–5.11)
RDW: 14.1 % (ref 11.5–15.5)
WBC: 10.5 10*3/uL (ref 4.0–10.5)

## 2015-03-20 LAB — BASIC METABOLIC PANEL
Anion gap: 5 (ref 5–15)
BUN: 15 mg/dL (ref 6–20)
CALCIUM: 8.5 mg/dL — AB (ref 8.9–10.3)
CO2: 28 mmol/L (ref 22–32)
CREATININE: 0.62 mg/dL (ref 0.44–1.00)
Chloride: 106 mmol/L (ref 101–111)
GFR calc Af Amer: >60 mL/min (ref >60)
Glucose, Bld: 150 mg/dL — ABNORMAL HIGH (ref 65–99)
Potassium: 4.5 mmol/L (ref 3.5–5.1)
SODIUM: 139 mmol/L (ref 135–145)

## 2015-03-20 NOTE — Clinical Social Work Placement (Signed)
   CLINICAL SOCIAL WORK PLACEMENT  NOTE  Date:  03/20/2015  Patient Details  Name: Margaret [name] MRN: [id number] Date of Birth: 29-Dec-1940  Clinical Social Work is seeking post-discharge placement for this patient at the [place] level of care (*CSW will initial, date and re-position this form in  chart as items are completed):  No   Patient/family provided with [place] Work Department's list of facilities offering this level of care within the geographic area requested by the patient (or if unable, by the patient's family).  Yes   Patient/family informed of their freedom to choose among providers that offer the needed level of care, that participate in Medicare, Medicaid or managed care program needed by the patient, have an available bed and are willing to accept the patient.  No   Patient/family informed of 's ownership interest in [facility] and [facility], as well as of the fact that they are under no obligation to receive care at these facilities.  PASRR submitted to EDS on 03/20/15     PASRR number received on 03/20/15     Existing PASRR number confirmed on       FL2 transmitted to all facilities in geographic area requested by pt/family on 03/20/15     FL2 transmitted to all facilities within larger geographic area on       Patient informed that his/her managed care company has contracts with or will negotiate with certain facilities, including the following:        Yes   Patient/family informed of bed offers received.  Patient chooses bed at [facility]     Physician recommends and patient chooses bed at [facility]    Patient to be transferred to [facility] on  .  Patient to be transferred to facility by       Patient family notified on   of transfer.  Name of family member notified:        PHYSICIAN       Additional Comment:    _______________________________________________ Loraine Maple  [phone] 03/20/2015, 3:44 PM

## 2015-03-20 NOTE — Care Management Note (Signed)
Case Management Note  Patient Details  Name: CLIFFIE GINGRAS MRN: [id number] Date of Birth: 1941-04-12  Subjective/Objective:                  RIGHT TOTAL HIP ARTHROPLASTY ANTERIOR APPROACH (Right)  Action/Plan: Discharge planning  Expected Discharge Date:  03/20/15               Expected Discharge Plan:  [place]  In-House Referral:     Discharge planning Services  CM Consult  Post Acute Care Choice:  NA Choice offered to:  NA  DME Arranged:    DME Agency:  NA  HH Arranged:  NA HH Agency:  NA  Status of Service:  Completed, signed off  Medicare Important Message Given:    Date Medicare IM Given:    Medicare IM give by:    Date Additional Medicare IM Given:    Additional Medicare Important Message give by:     If discussed at [place] of Stay Meetings, dates discussed:    Additional Comments: CM notes pt to go to SNF for rehab; CSW aware and arranging.  No other CM needs were communicated. Dellie Catholic, RN 03/20/2015, 10:55 AM

## 2015-03-20 NOTE — Progress Notes (Signed)
     Subjective: 1 Day Post-Op Procedure(s) (LRB): RIGHT TOTAL HIP ARTHROPLASTY ANTERIOR APPROACH (Right)   Patient reports pain as mild, pain controlled. No events throughout the night.   Objective:   VITALS:   Filed Vitals:   03/20/15 0915  BP: 116/47  Pulse: 66  Temp: 97.3 F (36.3 C)  Resp: 16    Dorsiflexion/Plantar flexion intact Incision: dressing C/D/I No cellulitis present Compartment soft  LABS  Recent Labs  03/20/15 0545  HGB 9.6*  HCT 28.8*  WBC 10.5  PLT 238     Recent Labs  03/20/15 0545  NA 139  K 4.5  BUN 15  CREATININE 0.62  GLUCOSE 150*     Assessment/Plan: 1 Day Post-Op Procedure(s) (LRB): RIGHT TOTAL HIP ARTHROPLASTY ANTERIOR APPROACH (Right) Foley cath d/c'ed Advance diet Up with therapy D/C IV fluids Discharge to SNF eventually, when ready  Overweight (BMI 25-29.9) Estimated body mass index is 28.65 kg/(m^2) as calculated from the following:   Height as of this encounter: 5\' 4"  (1.626 m).   Weight as of this encounter: 75.751 kg (167 lb). Patient also counseled that weight may inhibit the healing process Patient counseled that losing weight will help with future health issues      [place]. Valentine Barney   PAC  03/20/2015, 9:57 AM

## 2015-03-20 NOTE — Evaluation (Signed)
Physical Therapy Evaluation Patient Details Name: Margaret [name] MRN: [id number] DOB: 1941-03-20 Today's Date: 03/20/2015   History of Present Illness  s/p R DA THA  Clinical Impression  Patient tolerated ambulation very well. Patient will benefit from PT to address problems listed in note below.    Follow Up Recommendations SNF;Supervision - Intermittent    Equipment Recommendations       Recommendations for Other Services       Precautions / Restrictions Precautions Precautions: Fall      Mobility  Bed Mobility   Bed Mobility: Sit to Supine       Sit to supine: Min guard   General bed mobility comments: assist for RLE  Transfers   Equipment used: Rolling walker (2 wheeled) Transfers: Sit to/from Stand Sit to Stand: Min guard         General transfer comment: steadying assistance; cues for UE/LE placement  Ambulation/Gait Ambulation/Gait assistance: Min guard Ambulation Distance (Feet): 200 Feet Assistive device: Rolling walker (2 wheeled) Gait Pattern/deviations: Step-through pattern        Stairs            Wheelchair Mobility    Modified Rankin (Stroke Patients Only)       Balance                                             Pertinent Vitals/Pain Pain Score: 4  Pain Location: R hip Pain Descriptors / Indicators: Discomfort Pain Intervention(s): Limited activity within patient's tolerance;Premedicated before session;Repositioned;Ice applied    Home Living Family/patient expects to be discharged to:: Skilled nursing facility Living Arrangements: Other relatives;Alone               Additional Comments: has a walker; walk in shower at home    Prior Function Level of Independence: Independent               Hand Dominance        Extremity/Trunk Assessment   Upper Extremity Assessment: Defer to OT evaluation           Lower Extremity Assessment: RLE deficits/detail RLE Deficits /  Details: advances the   leg    Cervical / Trunk Assessment: Normal  Communication   Communication: No difficulties  Cognition Arousal/Alertness: Awake/alert Behavior During Therapy: WFL for tasks assessed/performed Overall Cognitive Status: Within Functional Limits for tasks assessed                      General Comments      Exercises Total Joint Exercises Ankle Circles/Pumps: AROM;Right;10 reps Quad Sets: AROM;Right;10 reps Short Arc Quad: AROM;Right;10 reps Heel Slides: AROM;Right;10 reps Hip ABduction/ADduction: AROM;10 reps;Right Long Arc Quad: AROM;Right;10 reps      Assessment/Plan    PT Assessment Patient needs continued PT services  PT Diagnosis Difficulty walking;Generalized weakness   PT Problem List Decreased strength;Decreased range of motion;Decreased activity tolerance;Decreased mobility;Decreased safety awareness  PT Treatment Interventions DME instruction;Gait training;Functional mobility training;Therapeutic activities;Patient/family education   PT Goals (Current goals can be found in the Care Plan section) Acute Rehab PT Goals Patient Stated Goal: rehab then home independently PT Goal Formulation: With patient Time For Goal Achievement: 03/27/15 Potential to Achieve Goals: Good    Frequency 7X/week   Barriers to discharge Decreased caregiver support      Co-evaluation  End of Session   Activity Tolerance: Patient tolerated treatment well Patient left: in bed;with call bell/phone within reach Nurse Communication: Mobility status         Time: 7218-2883 PT Time Calculation (min) (ACUTE ONLY): 37 min   Charges:     PT Treatments $Gait Training: 8-22 mins   PT G Codes:        Claretha Cooper 03/20/2015, 1:32 PM Tresa Endo PT [phone]

## 2015-03-20 NOTE — Progress Notes (Signed)
Physical Therapy Treatment Patient Details Name: Margaret [name] MRN: [id number] DOB: [birth year] Today's Date: 03/20/2015    History of Present Illness s/p R DA THA    PT Comments    Patient  Progressing well. Plans SNF.  Follow Up Recommendations  SNF;Supervision - Intermittent     Equipment Recommendations  None recommended by PT    Recommendations for Other Services       Precautions / Restrictions Precautions Precautions: Fall    Mobility  Bed Mobility   Bed Mobility: Supine to Sit     Supine to sit: Supervision Sit to supine: Min guard   General bed mobility comments: assist for RLE  Transfers Overall transfer level: Needs assistance Equipment used: Rolling walker (2 wheeled) Transfers: Sit to/from Stand Sit to Stand: Min guard         General transfer comment: cues for hand placement  Ambulation/Gait Ambulation/Gait assistance: Min guard Ambulation Distance (Feet): 200 Feet Assistive device: Rolling walker (2 wheeled) Gait Pattern/deviations: Step-through pattern         Stairs            Wheelchair Mobility    Modified Rankin (Stroke Patients Only)       Balance                                    Cognition Arousal/Alertness: Awake/alert Behavior During Therapy: WFL for tasks assessed/performed Overall Cognitive Status: Within Functional Limits for tasks assessed                      Exercises Total Joint Exercises Ankle Circles/Pumps: AROM;Right;10 reps Quad Sets: AROM;Right;10 reps Short Arc Quad: AROM;Right;10 reps Heel Slides: AROM;Right;10 reps Hip ABduction/ADduction: AROM;10 reps;Right Long Arc Quad: AROM;Right;10 reps    General Comments        Pertinent Vitals/Pain Pain Score: 2  Pain Location: R thigh Pain Descriptors / Indicators: Discomfort Pain Intervention(s): Premedicated before session;Monitored during session;Ice applied    Home Living Family/patient expects to be  discharged to:: Skilled nursing facility Living Arrangements: Other relatives;Alone             Additional Comments: has a walker; walk in shower at home    Prior Function Level of Independence: Independent          PT Goals (current goals can now be found in the care plan section) Acute Rehab PT Goals Patient Stated Goal: rehab then home independently PT Goal Formulation: With patient Time For Goal Achievement: 03/27/15 Potential to Achieve Goals: Good Progress towards PT goals: Progressing toward goals    Frequency  7X/week    PT Plan Current plan remains appropriate    Co-evaluation             End of Session   Activity Tolerance: Patient tolerated treatment well Patient left: in chair;with call bell/phone within reach     Time: 1343-1354 PT Time Calculation (min) (ACUTE ONLY): 11 min  Charges:  $Gait Training: 8-22 mins                    G Codes:      Claretha Cooper 03/20/2015, 3:23 PM

## 2015-03-20 NOTE — Evaluation (Signed)
Occupational Therapy Evaluation Patient Details Name: Margaret [name] MRN: [id number] DOB: 08-Jun-1941 Today's Date: 03/20/2015    History of Present Illness s/p R DA THA   Clinical Impression   This 74 year old female was admitted for the above surgery.  She was independent with adls prior to sx and will benefit from skilled OT in acute and post acute to return to independent level. Goals in acute are for supervision level    Follow Up Recommendations  SNF    Equipment Recommendations  3 in 1 bedside comode    Recommendations for Other Services       Precautions / Restrictions Precautions Precautions: Fall Restrictions Weight Bearing Restrictions: No      Mobility Bed Mobility Overal bed mobility: Needs Assistance Bed Mobility: Supine to Sit     Supine to sit: Min assist     General bed mobility comments: assist for RLE  Transfers Overall transfer level: Needs assistance Equipment used: Rolling walker (2 wheeled) Transfers: Sit to/from Stand Sit to Stand: Min assist         General transfer comment: steadying assistance; cues for UE/LE placement    Balance                                            ADL Overall ADL's : Needs assistance/impaired     Grooming: Oral care;Standing;Supervision/safety   Upper Body Bathing: Set up;Sitting   Lower Body Bathing: Minimal assistance;Sit to/from stand   Upper Body Dressing : Set up;Sitting   Lower Body Dressing: Moderate assistance;Sit to/from stand   Toilet Transfer: Minimal assistance;Ambulation;BSC;RW   Toileting- Water quality scientist and Hygiene: Min guard;Sit to/from stand         General ADL Comments: ambulated to bathroom and performed ADL/toilet transfer.     Vision     Perception     Praxis      Pertinent Vitals/Pain Pain Assessment: 0-10 Pain Score: 4  (with weightbearing) Pain Location: R hip Pain Descriptors / Indicators: Aching Pain Intervention(s):  Limited activity within patient's tolerance;Monitored during session;Premedicated before session;Repositioned     Hand Dominance Right   Extremity/Trunk Assessment Upper Extremity Assessment Upper Extremity Assessment: Overall WFL for tasks assessed           Communication Communication Communication: No difficulties   Cognition Arousal/Alertness: Awake/alert Behavior During Therapy: WFL for tasks assessed/performed Overall Cognitive Status: Within Functional Limits for tasks assessed                     General Comments       Exercises       Shoulder Instructions      Home Living Family/patient expects to be discharged to:: Skilled nursing facility ([facility]) Living Arrangements: Other relatives;Alone                               Additional Comments: has a walker; walk in shower at home      Prior Functioning/Environment Level of Independence: Independent             OT Diagnosis: Generalized weakness;Acute pain   OT Problem List: Decreased strength;Decreased knowledge of use of DME or AE;Pain   OT Treatment/Interventions: Self-care/ADL training;DME and/or AE instruction;Patient/family education    OT Goals(Current goals can be found in the care plan section) Acute  Rehab OT Goals Patient Stated Goal: rehab then home independently OT Goal Formulation: With patient Time For Goal Achievement: 03/27/15 Potential to Achieve Goals: Good ADL Goals Pt Will Perform Lower Body Bathing: with supervision;with adaptive equipment;sit to/from stand Pt Will Perform Lower Body Dressing: with supervision;with adaptive equipment;sit to/from stand Pt Will Transfer to Toilet: with supervision;ambulating;bedside commode Pt Will Perform Toileting - Clothing Manipulation and hygiene: with supervision;sit to/from stand  OT Frequency: Min 2X/week   Barriers to D/C:            Co-evaluation              End of Session    Activity  Tolerance: Patient tolerated treatment well Patient left: in chair;with call bell/phone within reach   Time: 0735-0801 OT Time Calculation (min): 26 min Charges:  OT General Charges $OT Visit: 1 Procedure OT Evaluation $Initial OT Evaluation Tier I: 1 Procedure OT Treatments $Self Care/Home Management : 8-22 mins G-Codes:    Henrine Hayter 31-Mar-2015, 9:12 AM Lesle Chris, OTR/L [phone] 03/31/2015

## 2015-03-20 NOTE — Clinical Social Work Note (Signed)
Clinical Social Work Assessment  Patient Details  Name: Margaret [name] MRN: [id number] Date of Birth: 12-13-1940  Date of referral:  03/20/15               Reason for consult:  Facility Placement, Discharge Planning                Permission sought to share information with:  Chartered certified accountant granted to share information::  Yes, Verbal Permission Granted  Name::        Agency::     Relationship::     Contact Information:     Housing/Transportation Living arrangements for the past 2 months:  Single Family Home Source of Information:  Patient Patient Interpreter Needed:  None Criminal Activity/Legal Involvement Pertinent to Current Situation/Hospitalization:  No - Comment as needed Significant Relationships:  Siblings Lives with:  Self Do you feel safe going back to the place where you live?   (ST Rehab needed.) Need for family participation in patient care:  No (Coment)  Care giving concerns:  Pt's care cannot be managed at home following hospital d/c.   Social Worker assessment / plan:  Pt hospitalized on 03/19/15 for pre planned right total hip arthroplasty. CSW met with pt to assist with d/c planning. PT has recommended ST Rehab at d/c. Pt is in agreement with this plan. Pt has made prior arrangements to have rehab at [facility] at d/c. CSW has contacted SNF and d/c plan has been confirmed. CSW will continue to follow to assist with d/c planning to SNF.  Employment status:  Retired Forensic scientist:  Medicare PT Recommendations:  [place] / Referral to community resources:  [place]  Patient/Family's Response to care:  Pt feels rehab is needed.  Patient/Family's Understanding of and Emotional Response to Diagnosis, Current Treatment, and Prognosis:  Pt is aware of her medical status and is motivated to work with therapy. Pt is looking forward to having ST rehab at [facility].  Emotional  Assessment Appearance:  Appears stated age Attitude/Demeanor/Rapport:  Other (cooperative) Affect (typically observed):  Calm, Pleasant Orientation:  Oriented to Self, Oriented to Place, Oriented to  Time, Oriented to Situation Alcohol / Substance use:  Not Applicable Psych involvement (Current and /or in the community):  No (Comment)  Discharge Needs  Concerns to be addressed:  Discharge Planning Concerns Readmission within the last 30 days:  No Current discharge risk:  None Barriers to Discharge:  No Barriers Identified   Loraine Maple  [phone] 03/20/2015, 3:39 PM

## 2015-03-21 DIAGNOSIS — E663 Overweight: Secondary | ICD-10-CM | POA: Diagnosis present

## 2015-03-21 LAB — BASIC METABOLIC PANEL
ANION GAP: 5 (ref 5–15)
BUN: 15 mg/dL (ref 6–20)
CALCIUM: 8.4 mg/dL — AB (ref 8.9–10.3)
CHLORIDE: 102 mmol/L (ref 101–111)
CO2: 28 mmol/L (ref 22–32)
Creatinine, Ser: 0.7 mg/dL (ref 0.44–1.00)
GFR calc Af Amer: >60 mL/min (ref >60)
GFR calc non Af Amer: >60 mL/min (ref >60)
GLUCOSE: 114 mg/dL — AB (ref 65–99)
Potassium: 4.2 mmol/L (ref 3.5–5.1)
Sodium: 135 mmol/L (ref 135–145)

## 2015-03-21 LAB — CBC
HEMATOCRIT: 25.2 % — AB (ref 36.0–46.0)
HEMOGLOBIN: 8.5 g/dL — AB (ref 12.0–15.0)
MCH: 30.9 pg (ref 26.0–34.0)
MCHC: 33.7 g/dL (ref 30.0–36.0)
MCV: 91.6 fL (ref 78.0–100.0)
Platelets: 211 10*3/uL (ref 150–400)
RBC: 2.75 MIL/uL — ABNORMAL LOW (ref 3.87–5.11)
RDW: 14.1 % (ref 11.5–15.5)
WBC: 9.3 10*3/uL (ref 4.0–10.5)

## 2015-03-21 MED ORDER — DOCUSATE SODIUM 100 MG PO CAPS: 100.0000 mg | ORAL_CAPSULE | Freq: Two times a day (BID) | ORAL | Status: DC

## 2015-03-21 MED ORDER — FERROUS SULFATE 325 (65 FE) MG PO TABS: 325.0000 mg | ORAL_TABLET | Freq: Three times a day (TID) | ORAL | Status: DC

## 2015-03-21 MED ORDER — HYDROCODONE-ACETAMINOPHEN 7.5-325 MG PO TABS: 1.0000 | ORAL_TABLET | ORAL | Status: DC | PRN

## 2015-03-21 MED ORDER — ASPIRIN 325 MG PO TBEC: 325.0000 mg | DELAYED_RELEASE_TABLET | Freq: Two times a day (BID) | ORAL | Status: AC

## 2015-03-21 MED ORDER — POLYETHYLENE GLYCOL 3350 17 G PO PACK: 17.0000 g | PACK | Freq: Two times a day (BID) | ORAL | Status: DC

## 2015-03-21 MED ORDER — TIZANIDINE HCL 4 MG PO TABS: 4.0000 mg | ORAL_TABLET | Freq: Four times a day (QID) | ORAL | Status: DC | PRN

## 2015-03-21 NOTE — Progress Notes (Signed)
Report called and given to Nursing Supervisor over at [facility] Anastasia Fiedler Rn 12:12 PM 03-21-2015
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
## 2015-03-21 NOTE — Discharge Instructions (Signed)

## 2015-03-21 NOTE — Progress Notes (Signed)
     Subjective: 2 Days Post-Op Procedure(s) (LRB): RIGHT TOTAL HIP ARTHROPLASTY ANTERIOR APPROACH (Right)   Patient reports pain as mild, pain controlled. No events throughout the night. Ready to be discharged home.  Objective:   VITALS:   Filed Vitals:   03/21/15 0511  BP: 103/49  Pulse: 77  Temp: 97.8 F (36.6 C)  Resp: 16    Dorsiflexion/Plantar flexion intact Incision: dressing C/D/I No cellulitis present Compartment soft  LABS  Recent Labs  03/20/15 0545 03/21/15 0422  HGB 9.6* 8.5*  HCT 28.8* 25.2*  WBC 10.5 9.3  PLT 238 211     Recent Labs  03/20/15 0545 03/21/15 0422  NA 139 135  K 4.5 4.2  BUN 15 15  CREATININE 0.62 0.70  GLUCOSE 150* 114*     Assessment/Plan: 2 Days Post-Op Procedure(s) (LRB): RIGHT TOTAL HIP ARTHROPLASTY ANTERIOR APPROACH (Right) Up with therapy Discharge to SNF  Follow up in 2 weeks at [facility]. Follow up with OLIN,Brinnley Lacap D in 2 weeks.  Contact information:  [facility] [address], [place] [phone]    Overweight (BMI 25-29.9) Estimated body mass index is 28.65 kg/(m^2) as calculated from the following:   Height as of this encounter: 5\' 4"  (1.626 m).   Weight as of this encounter: 75.751 kg (167 lb). Patient also counseled that weight may inhibit the healing process Patient counseled that losing weight will help with future health issues       West Pugh. Waynesha Rammel   PAC  03/21/2015, 8:34 AM

## 2015-03-21 NOTE — Clinical Social Work Placement (Signed)
   CLINICAL SOCIAL WORK PLACEMENT  NOTE  Date:  03/21/2015  Patient Details  Name: Margaret [name] MRN: [id number] Date of Birth: July 19, 1940  Clinical Social Work is seeking post-discharge placement for this patient at the [place] level of care (*CSW will initial, date and re-position this form in  chart as items are completed):  No   Patient/family provided with [facility] Work Department's list of facilities offering this level of care within the geographic area requested by the patient (or if unable, by the patient's family).  Yes   Patient/family informed of their freedom to choose among providers that offer the needed level of care, that participate in Medicare, Medicaid or managed care program needed by the patient, have an available bed and are willing to accept the patient.  No   Patient/family informed of [facility] ownership interest in [facility] and [facility], as well as of the fact that they are under no obligation to receive care at these facilities.  PASRR submitted to EDS on 03/20/15     PASRR number received on 03/20/15     Existing PASRR number confirmed on       FL2 transmitted to all facilities in geographic area requested by pt/family on 03/20/15     FL2 transmitted to all facilities within larger geographic area on       Patient informed that his/her managed care company has contracts with or will negotiate with certain facilities, including the following:        Yes   Patient/family informed of bed offers received.  Patient chooses bed at [facility]     Physician recommends and patient chooses bed at [facility]    Patient to be transferred to [facility] on 03/21/15.  Patient to be transferred to facility by [place]     Patient family notified on 03/21/15 of transfer.  Name of family member notified:  SON     PHYSICIAN       Additional Comment: Pt/ son are  in agreement with plan for d/c to [facility] today. PT has approved transport by car. NSG reviewed d/c summary, scripts, avs. Scripts included in d/c packet. D/C summary sent to SNF for review prior to d/c. D/C packet provided to pt prior to d/c.    _______________________________________________ Luretha Rued, LCSW  [phone] 03/21/2015, 3:00 PM

## 2015-03-22 ENCOUNTER — Encounter: Payer: Self-pay | Admitting: Adult Health

## 2015-03-22 ENCOUNTER — Non-Acute Institutional Stay (SKILLED_NURSING_FACILITY): Payer: Medicare Other | Admitting: Adult Health

## 2015-03-22 DIAGNOSIS — E038 Other specified hypothyroidism: Secondary | ICD-10-CM

## 2015-03-22 DIAGNOSIS — G4700 Insomnia, unspecified: Secondary | ICD-10-CM

## 2015-03-22 DIAGNOSIS — D62 Acute posthemorrhagic anemia: Secondary | ICD-10-CM

## 2015-03-22 DIAGNOSIS — K5900 Constipation, unspecified: Secondary | ICD-10-CM

## 2015-03-22 DIAGNOSIS — K219 Gastro-esophageal reflux disease without esophagitis: Secondary | ICD-10-CM

## 2015-03-22 DIAGNOSIS — E785 Hyperlipidemia, unspecified: Secondary | ICD-10-CM

## 2015-03-22 DIAGNOSIS — M1611 Unilateral primary osteoarthritis, right hip: Secondary | ICD-10-CM

## 2015-03-22 DIAGNOSIS — I10 Essential (primary) hypertension: Secondary | ICD-10-CM

## 2015-03-25 ENCOUNTER — Non-Acute Institutional Stay (SKILLED_NURSING_FACILITY): Payer: Medicare Other | Admitting: Internal Medicine

## 2015-03-25 DIAGNOSIS — G4700 Insomnia, unspecified: Secondary | ICD-10-CM

## 2015-03-25 DIAGNOSIS — E7800 Pure hypercholesterolemia, unspecified: Secondary | ICD-10-CM

## 2015-03-25 DIAGNOSIS — K219 Gastro-esophageal reflux disease without esophagitis: Secondary | ICD-10-CM

## 2015-03-25 DIAGNOSIS — K5900 Constipation, unspecified: Secondary | ICD-10-CM

## 2015-03-25 DIAGNOSIS — D62 Acute posthemorrhagic anemia: Secondary | ICD-10-CM

## 2015-03-25 DIAGNOSIS — M1611 Unilateral primary osteoarthritis, right hip: Secondary | ICD-10-CM

## 2015-03-25 DIAGNOSIS — E038 Other specified hypothyroidism: Secondary | ICD-10-CM | POA: Diagnosis not present

## 2015-03-25 DIAGNOSIS — R2681 Unsteadiness on feet: Secondary | ICD-10-CM | POA: Diagnosis not present
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
## 2015-03-26 ENCOUNTER — Encounter: Payer: Self-pay | Admitting: Adult Health

## 2015-03-26 ENCOUNTER — Non-Acute Institutional Stay (SKILLED_NURSING_FACILITY): Payer: Medicare Other | Admitting: Adult Health

## 2015-03-26 DIAGNOSIS — D62 Acute posthemorrhagic anemia: Secondary | ICD-10-CM | POA: Diagnosis not present

## 2015-03-26 DIAGNOSIS — I10 Essential (primary) hypertension: Secondary | ICD-10-CM | POA: Diagnosis not present

## 2015-03-26 DIAGNOSIS — E038 Other specified hypothyroidism: Secondary | ICD-10-CM

## 2015-03-26 DIAGNOSIS — G4700 Insomnia, unspecified: Secondary | ICD-10-CM | POA: Diagnosis not present

## 2015-03-26 DIAGNOSIS — K219 Gastro-esophageal reflux disease without esophagitis: Secondary | ICD-10-CM | POA: Diagnosis not present

## 2015-03-26 DIAGNOSIS — K5900 Constipation, unspecified: Secondary | ICD-10-CM

## 2015-03-26 DIAGNOSIS — M1611 Unilateral primary osteoarthritis, right hip: Secondary | ICD-10-CM | POA: Diagnosis not present

## 2015-03-26 DIAGNOSIS — E785 Hyperlipidemia, unspecified: Secondary | ICD-10-CM | POA: Diagnosis not present

## 2015-06-27 ENCOUNTER — Other Ambulatory Visit: Payer: Self-pay | Admitting: Family Medicine

## 2015-07-05 ENCOUNTER — Encounter (HOSPITAL_COMMUNITY)
Admission: RE | Admit: 2015-07-05 | Discharge: 2015-07-05 | Disposition: A | Discharge status: Home / Self Care | Payer: Medicare Other | Source: Ambulatory Visit | Attending: Orthopedic Surgery | Admitting: Orthopedic Surgery

## 2015-07-05 ENCOUNTER — Encounter (HOSPITAL_COMMUNITY): Payer: Self-pay

## 2015-07-05 DIAGNOSIS — M9701XA Periprosthetic fracture around internal prosthetic right hip joint, initial encounter: Secondary | ICD-10-CM | POA: Insufficient documentation

## 2015-07-05 DIAGNOSIS — Z01812 Encounter for preprocedural laboratory examination: Secondary | ICD-10-CM | POA: Insufficient documentation

## 2015-07-05 DIAGNOSIS — X58XXXA Exposure to other specified factors, initial encounter: Secondary | ICD-10-CM | POA: Diagnosis not present

## 2015-07-05 HISTORY — DX: Personal history of other medical treatment: Z92.89

## 2015-07-05 HISTORY — DX: Personal history of irradiation: Z92.3

## 2015-07-05 HISTORY — DX: Personal history of other diseases of the respiratory system: Z87.09

## 2015-07-05 HISTORY — DX: Presence of spectacles and contact lenses: Z97.3

## 2015-07-05 HISTORY — DX: Personal history of antineoplastic chemotherapy: Z92.21

## 2015-07-05 LAB — SURGICAL PCR SCREEN
MRSA, PCR: NEGATIVE
Staphylococcus aureus: NEGATIVE

## 2015-07-05 LAB — CBC
HCT: 35.1 % — ABNORMAL LOW (ref 36.0–46.0)
HEMOGLOBIN: 11.3 g/dL — AB (ref 12.0–15.0)
MCH: 29.4 pg (ref 26.0–34.0)
MCHC: 32.2 g/dL (ref 30.0–36.0)
MCV: 91.2 fL (ref 78.0–100.0)
PLATELETS: 322 10*3/uL (ref 150–400)
RBC: 3.85 MIL/uL — ABNORMAL LOW (ref 3.87–5.11)
RDW: 14.1 % (ref 11.5–15.5)
WBC: 7.7 10*3/uL (ref 4.0–10.5)

## 2015-07-05 LAB — URINALYSIS, ROUTINE W REFLEX MICROSCOPIC
Bilirubin Urine: NEGATIVE
GLUCOSE, UA: NEGATIVE mg/dL
Hgb urine dipstick: NEGATIVE
Ketones, ur: NEGATIVE mg/dL
Nitrite: NEGATIVE
PH: 6 (ref 5.0–8.0)
Protein, ur: NEGATIVE mg/dL
SPECIFIC GRAVITY, URINE: 1.015 (ref 1.005–1.030)

## 2015-07-05 LAB — BASIC METABOLIC PANEL
ANION GAP: 9 (ref 5–15)
BUN: 18 mg/dL (ref 6–20)
CALCIUM: 9.3 mg/dL (ref 8.9–10.3)
CO2: 28 mmol/L (ref 22–32)
Chloride: 100 mmol/L — ABNORMAL LOW (ref 101–111)
Creatinine, Ser: 0.71 mg/dL (ref 0.44–1.00)
GFR calc Af Amer: >60 mL/min (ref >60)
GLUCOSE: 99 mg/dL (ref 65–99)
Potassium: 3.8 mmol/L (ref 3.5–5.1)
Sodium: 137 mmol/L (ref 135–145)

## 2015-07-05 LAB — URINE MICROSCOPIC-ADD ON

## 2015-07-05 LAB — PROTIME-INR
INR: 1.01 (ref 0.00–1.49)
PROTHROMBIN TIME: 13.5 s (ref 11.6–15.2)

## 2015-07-05 LAB — APTT: APTT: 28 s (ref 24–37)

## 2015-07-05 NOTE — Progress Notes (Addendum)
CBC, urinalysis and micro results per epic per PAT visit 07/05/2015 sent to Dr Alvan Dame

## 2015-07-05 NOTE — Patient Instructions (Signed)
Margaret [name]  07/05/2015   Your procedure is scheduled on: Monday July 15, 2015  Report to [facility] Main  Entrance take [facility]  elevators to 3rd floor to  [place] at 7:00 AM.  Call this number if you have problems the morning of surgery [phone]   Remember: ONLY 1 PERSON MAY GO WITH YOU TO SHORT STAY TO GET  READY MORNING OF [place].  Do not eat food or drink liquids :After Midnight.     Take these medicines the morning of surgery with A SIP OF WATER: Famotidine (Pepcid); Levothyroxine; May use Albuterol Inhaler if needed (bring with you day of surgery)                               You may not have any metal on your body including hair pins and              piercings  Do not wear jewelry, make-up, lotions, powders or perfumes, deodorant             Do not wear nail polish.  Do not shave  48 hours prior to surgery.                Do not bring valuables to the hospital. [place].  Contacts, dentures or bridgework may not be worn into surgery.  Leave suitcase in the car. After surgery it may be brought to your room.              BRING CPAP MASK AND TUBING DAY OF SURGERY                Please read over the following fact sheets you were given:MRSA INFORMATION SHEET; INCENTIVE SPIROMETER; BLOOD TRANSFUSION INFORMATION SHEET  _____________________________________________________________________             [facility] - Preparing for Surgery Before surgery, you can play an important role.  Because skin is not sterile, your skin needs to be as free of germs as possible.  You can reduce the number of germs on your skin by washing with CHG (chlorahexidine gluconate) soap before surgery.  CHG is an antiseptic cleaner which kills germs and bonds with the skin to continue killing germs even after washing. Please DO NOT use if you have an allergy to CHG or antibacterial soaps.  If your skin  becomes reddened/irritated stop using the CHG and inform your nurse when you arrive at Short Stay. Do not shave (including legs and underarms) for at least 48 hours prior to the first CHG shower.  You may shave your face/neck. Please follow these instructions carefully:  1.  Shower with CHG Soap the night before surgery and the  morning of Surgery.  2.  If you choose to wash your hair, wash your hair first as usual with your  normal  shampoo.  3.  After you shampoo, rinse your hair and body thoroughly to remove the  shampoo.                           4.  Use CHG as you would any other liquid soap.  You can apply chg directly  to the skin  and wash                       Gently with a scrungie or clean washcloth.  5.  Apply the CHG Soap to your body ONLY FROM THE NECK DOWN.   Do not use on face/ open                           Wound or open sores. Avoid contact with eyes, ears mouth and genitals (private parts).                       Wash face,  Genitals (private parts) with your normal soap.             6.  Wash thoroughly, paying special attention to the area where your surgery  will be performed.  7.  Thoroughly rinse your body with warm water from the neck down.  8.  DO NOT shower/wash with your normal soap after using and rinsing off  the CHG Soap.                9.  Pat yourself dry with a clean towel.            10.  Wear clean pajamas.            11.  Place clean sheets on your bed the night of your first shower and do not  sleep with pets. Day of Surgery : Do not apply any lotions/deodorants the morning of surgery.  Please wear clean clothes to the hospital/surgery center.  FAILURE TO FOLLOW THESE INSTRUCTIONS MAY RESULT IN THE CANCELLATION OF YOUR SURGERY PATIENT SIGNATURE_________________________________  NURSE SIGNATURE__________________________________  ________________________________________________________________________   Adam Phenix  An incentive spirometer is a  tool that can help keep your lungs clear and active. This tool measures how well you are filling your lungs with each breath. Taking long deep breaths may help reverse or decrease the chance of developing breathing (pulmonary) problems (especially infection) following:  A long period of time when you are unable to move or be active. BEFORE THE PROCEDURE   If the spirometer includes an indicator to show your best effort, your nurse or respiratory therapist will set it to a desired goal.  If possible, sit up straight or lean slightly forward. Try not to slouch.  Hold the incentive spirometer in an upright position. INSTRUCTIONS FOR USE   Sit on the edge of your bed if possible, or sit up as far as you can in bed or on a chair.  Hold the incentive spirometer in an upright position.  Breathe out normally.  Place the mouthpiece in your mouth and seal your lips tightly around it.  Breathe in slowly and as deeply as possible, raising the piston or the ball toward the top of the column.  Hold your breath for 3-5 seconds or for as long as possible. Allow the piston or ball to fall to the bottom of the column.  Remove the mouthpiece from your mouth and breathe out normally.  Rest for a few seconds and repeat Steps 1 through 7 at least 10 times every 1-2 hours when you are awake. Take your time and take a few normal breaths between deep breaths.  The spirometer may include an indicator to show your best effort. Use the indicator as a goal to work toward during each repetition.  After each set of 10  deep breaths, practice coughing to be sure your lungs are clear. If you have an incision (the cut made at the time of surgery), support your incision when coughing by placing a pillow or rolled up towels firmly against it. Once you are able to get out of bed, walk around indoors and cough well. You may stop using the incentive spirometer when instructed by your caregiver.  RISKS AND  COMPLICATIONS  Take your time so you do not get dizzy or light-headed.  If you are in pain, you may need to take or ask for pain medication before doing incentive spirometry. It is harder to take a deep breath if you are having pain. AFTER USE  Rest and breathe slowly and easily.  It can be helpful to keep track of a log of your progress. Your caregiver can provide you with a simple table to help with this. If you are using the spirometer at home, follow these instructions: [place] IF:   You are having difficultly using the spirometer.  You have trouble using the spirometer as often as instructed.  Your pain medication is not giving enough relief while using the spirometer.  You develop fever of 100.5 F (38.1 C) or higher. SEEK IMMEDIATE MEDICAL CARE IF:   You cough up bloody sputum that had not been present before.  You develop fever of 102 F (38.9 C) or greater.  You develop worsening pain at or near the incision site. MAKE SURE YOU:   Understand these instructions.  Will watch your condition.  Will get help right away if you are not doing well or get worse. Document Released: 11/02/2006 Document Revised: 09/14/2011 Document Reviewed: 01/03/2007 Exit[employer] Patient Information 2014 [employer], [place].   ________________________________________________________________________  WHAT IS A BLOOD TRANSFUSION? Blood Transfusion Information  A transfusion is the replacement of blood or some of its parts. Blood is made up of multiple cells which provide different functions.  Red blood cells carry oxygen and are used for blood loss replacement.  White blood cells fight against infection.  Platelets control bleeding.  Plasma helps clot blood.  Other blood products are available for specialized needs, such as hemophilia or other clotting disorders. BEFORE THE TRANSFUSION  Who gives blood for transfusions?   Healthy volunteers who are fully evaluated to make sure  their blood is safe. This is blood bank blood. Transfusion therapy is the safest it has ever been in the practice of medicine. Before blood is taken from a donor, a complete history is taken to make sure that person has no history of diseases nor engages in risky social behavior (examples are intravenous drug use or sexual activity with multiple partners). The donor's travel history is screened to minimize risk of transmitting infections, such as malaria. The donated blood is tested for signs of infectious diseases, such as HIV and hepatitis. The blood is then tested to be sure it is compatible with you in order to minimize the chance of a transfusion reaction. If you or a relative donates blood, this is often done in anticipation of surgery and is not appropriate for emergency situations. It takes many days to process the donated blood. RISKS AND COMPLICATIONS Although transfusion therapy is very safe and saves many lives, the main dangers of transfusion include:   Getting an infectious disease.  Developing a transfusion reaction. This is an allergic reaction to something in the blood you were given. Every precaution is taken to prevent this. The decision to have a blood transfusion  has been considered carefully by your caregiver before blood is given. Blood is not given unless the benefits outweigh the risks. AFTER THE TRANSFUSION  Right after receiving a blood transfusion, you will usually feel much better and more energetic. This is especially true if your red blood cells have gotten low (anemic). The transfusion raises the level of the red blood cells which carry oxygen, and this usually causes an energy increase.  The nurse administering the transfusion will monitor you carefully for complications. HOME CARE INSTRUCTIONS  No special instructions are needed after a transfusion. You may find your energy is better. Speak with your caregiver about any limitations on activity for underlying diseases  you may have. SEEK MEDICAL CARE IF:   Your condition is not improving after your transfusion.  You develop redness or irritation at the intravenous (IV) site. SEEK IMMEDIATE MEDICAL CARE IF:  Any of the following symptoms occur over the next 12 hours:  Shaking chills.  You have a temperature by mouth above 102 F (38.9 C), not controlled by medicine.  Chest, back, or muscle pain.  People around you feel you are not acting correctly or are confused.  Shortness of breath or difficulty breathing.  Dizziness and fainting.  You get a rash or develop hives.  You have a decrease in urine output.  Your urine turns a dark color or changes to pink, red, or brown. Any of the following symptoms occur over the next 10 days:  You have a temperature by mouth above 102 F (38.9 C), not controlled by medicine.  Shortness of breath.  Weakness after normal activity.  The white part of the eye turns yellow (jaundice).  You have a decrease in the amount of urine or are urinating less often.  Your urine turns a dark color or changes to pink, red, or brown. Document Released: 06/19/2000 Document Revised: 09/14/2011 Document Reviewed: 02/06/2008 [facility] Patient Information 2014 [place], [place].  _______________________________________________________________________

## 2015-07-05 NOTE — Progress Notes (Signed)
EKG/epic 12/05/2014 ECHO / epic 08/15/2008
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
## 2015-07-11 ENCOUNTER — Other Ambulatory Visit: Payer: Self-pay | Admitting: Adult Health
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
## 2015-07-15 ENCOUNTER — Encounter (HOSPITAL_COMMUNITY): Payer: Self-pay | Admitting: *Deleted

## 2015-07-15 ENCOUNTER — Inpatient Hospital Stay (HOSPITAL_COMMUNITY): Payer: Medicare Other

## 2015-07-15 ENCOUNTER — Inpatient Hospital Stay (HOSPITAL_COMMUNITY): Payer: Medicare Other | Admitting: Certified Registered"

## 2015-07-15 ENCOUNTER — Inpatient Hospital Stay (HOSPITAL_COMMUNITY)
Admission: RE | Admit: 2015-07-15 | Discharge: 2015-07-18 | DRG: 467 | Disposition: A | Discharge status: Skilled Nursing Facility | Payer: Medicare Other | Source: Ambulatory Visit | Attending: Orthopedic Surgery | Admitting: Orthopedic Surgery

## 2015-07-15 ENCOUNTER — Encounter (HOSPITAL_COMMUNITY)
Admission: RE | Disposition: A | Discharge status: Skilled Nursing Facility | Payer: Self-pay | Source: Ambulatory Visit | Attending: Orthopedic Surgery

## 2015-07-15 DIAGNOSIS — Z6825 Body mass index (BMI) 25.0-25.9, adult: Secondary | ICD-10-CM

## 2015-07-15 DIAGNOSIS — Z01812 Encounter for preprocedural laboratory examination: Secondary | ICD-10-CM

## 2015-07-15 DIAGNOSIS — Z853 Personal history of malignant neoplasm of breast: Secondary | ICD-10-CM | POA: Diagnosis not present

## 2015-07-15 DIAGNOSIS — E663 Overweight: Secondary | ICD-10-CM | POA: Diagnosis present

## 2015-07-15 DIAGNOSIS — K219 Gastro-esophageal reflux disease without esophagitis: Secondary | ICD-10-CM | POA: Diagnosis present

## 2015-07-15 DIAGNOSIS — Z923 Personal history of irradiation: Secondary | ICD-10-CM | POA: Diagnosis not present

## 2015-07-15 DIAGNOSIS — Z833 Family history of diabetes mellitus: Secondary | ICD-10-CM

## 2015-07-15 DIAGNOSIS — M96661 Fracture of femur following insertion of orthopedic implant, joint prosthesis, or bone plate, right leg: Secondary | ICD-10-CM | POA: Diagnosis present

## 2015-07-15 DIAGNOSIS — M1990 Unspecified osteoarthritis, unspecified site: Secondary | ICD-10-CM | POA: Diagnosis present

## 2015-07-15 DIAGNOSIS — G4733 Obstructive sleep apnea (adult) (pediatric): Secondary | ICD-10-CM | POA: Diagnosis present

## 2015-07-15 DIAGNOSIS — E7800 Pure hypercholesterolemia, unspecified: Secondary | ICD-10-CM | POA: Diagnosis present

## 2015-07-15 DIAGNOSIS — Z85828 Personal history of other malignant neoplasm of skin: Secondary | ICD-10-CM

## 2015-07-15 DIAGNOSIS — Z96649 Presence of unspecified artificial hip joint: Secondary | ICD-10-CM

## 2015-07-15 DIAGNOSIS — Z9221 Personal history of antineoplastic chemotherapy: Secondary | ICD-10-CM | POA: Diagnosis not present

## 2015-07-15 DIAGNOSIS — I10 Essential (primary) hypertension: Secondary | ICD-10-CM | POA: Diagnosis present

## 2015-07-15 DIAGNOSIS — Z8249 Family history of ischemic heart disease and other diseases of the circulatory system: Secondary | ICD-10-CM | POA: Diagnosis not present

## 2015-07-15 DIAGNOSIS — M8580 Other specified disorders of bone density and structure, unspecified site: Secondary | ICD-10-CM | POA: Diagnosis present

## 2015-07-15 DIAGNOSIS — E039 Hypothyroidism, unspecified: Secondary | ICD-10-CM | POA: Diagnosis present

## 2015-07-15 DIAGNOSIS — Y838 Other surgical procedures as the cause of abnormal reaction of the patient, or of later complication, without mention of misadventure at the time of the procedure: Secondary | ICD-10-CM | POA: Diagnosis present

## 2015-07-15 DIAGNOSIS — M25551 Pain in right hip: Secondary | ICD-10-CM | POA: Diagnosis present

## 2015-07-15 DIAGNOSIS — D62 Acute posthemorrhagic anemia: Secondary | ICD-10-CM | POA: Diagnosis not present

## 2015-07-15 DIAGNOSIS — M978XXS Periprosthetic fracture around other internal prosthetic joint, sequela: Secondary | ICD-10-CM

## 2015-07-15 DIAGNOSIS — M9701XA Periprosthetic fracture around internal prosthetic right hip joint, initial encounter: Secondary | ICD-10-CM | POA: Diagnosis present

## 2015-07-15 HISTORY — PX: TOTAL HIP REVISION: SHX763

## 2015-07-15 SURGERY — TOTAL HIP REVISION
Anesthesia: Spinal | Site: Hip | Laterality: Right

## 2015-07-15 MED ORDER — MENTHOL 3 MG MT LOZG: 1.0000 | LOZENGE | OROMUCOSAL | Status: DC | PRN

## 2015-07-15 MED ORDER — DIPHENHYDRAMINE HCL 25 MG PO CAPS: 25.0000 mg | ORAL_CAPSULE | Freq: Four times a day (QID) | ORAL | Status: DC | PRN

## 2015-07-15 MED ORDER — HYDROMORPHONE HCL 1 MG/ML IJ SOLN
INTRAMUSCULAR | Status: AC
  Filled 2015-07-15: qty 1

## 2015-07-15 MED ORDER — PROPOFOL 500 MG/50ML IV EMUL
INTRAVENOUS | Status: DC | PRN
  Administered 2015-07-15: 100 ug/kg/min via INTRAVENOUS

## 2015-07-15 MED ORDER — LACTATED RINGERS IV SOLN
INTRAVENOUS | Status: DC
  Administered 2015-07-15 (×2): via INTRAVENOUS
  Administered 2015-07-15: 1000 mL via INTRAVENOUS

## 2015-07-15 MED ORDER — AMLODIPINE BESYLATE 10 MG PO TABS
10.0000 mg | ORAL_TABLET | Freq: Every day | ORAL | Status: DC
  Administered 2015-07-16 – 2015-07-18 (×3): 10 mg via ORAL
  Filled 2015-07-15 (×3): qty 1

## 2015-07-15 MED ORDER — ATORVASTATIN CALCIUM 20 MG PO TABS
20.0000 mg | ORAL_TABLET | Freq: Every evening | ORAL | Status: DC
  Administered 2015-07-15 – 2015-07-17 (×3): 20 mg via ORAL
  Filled 2015-07-15 (×4): qty 1

## 2015-07-15 MED ORDER — ONDANSETRON HCL 4 MG/2ML IJ SOLN
INTRAMUSCULAR | Status: DC | PRN
  Administered 2015-07-15: 4 mg via INTRAVENOUS

## 2015-07-15 MED ORDER — DEXAMETHASONE SODIUM PHOSPHATE 10 MG/ML IJ SOLN
10.0000 mg | Freq: Once | INTRAMUSCULAR | Status: AC
  Administered 2015-07-16: 10 mg via INTRAVENOUS
  Filled 2015-07-15: qty 1

## 2015-07-15 MED ORDER — PROPOFOL 10 MG/ML IV BOLUS
INTRAVENOUS | Status: AC
  Filled 2015-07-15: qty 20

## 2015-07-15 MED ORDER — MIDAZOLAM HCL 5 MG/5ML IJ SOLN
INTRAMUSCULAR | Status: DC | PRN
  Administered 2015-07-15: 2 mg via INTRAVENOUS

## 2015-07-15 MED ORDER — MIDAZOLAM HCL 2 MG/2ML IJ SOLN
INTRAMUSCULAR | Status: AC
  Filled 2015-07-15: qty 2

## 2015-07-15 MED ORDER — CEFAZOLIN SODIUM-DEXTROSE 2-3 GM-% IV SOLR
INTRAVENOUS | Status: AC
Start: 2015-07-15 — End: 2015-07-15
  Filled 2015-07-15: qty 50

## 2015-07-15 MED ORDER — MEPERIDINE HCL 50 MG/ML IJ SOLN
6.2500 mg | INTRAMUSCULAR | Status: DC | PRN
Start: 2015-07-15 — End: 2015-07-15

## 2015-07-15 MED ORDER — HYDROCHLOROTHIAZIDE 12.5 MG PO CAPS
12.5000 mg | ORAL_CAPSULE | Freq: Every day | ORAL | Status: DC
  Administered 2015-07-16 – 2015-07-18 (×3): 12.5 mg via ORAL
  Filled 2015-07-15 (×3): qty 1

## 2015-07-15 MED ORDER — PHENOL 1.4 % MT LIQD
1.0000 | OROMUCOSAL | Status: DC | PRN
  Filled 2015-07-15: qty 177

## 2015-07-15 MED ORDER — FENTANYL CITRATE (PF) 100 MCG/2ML IJ SOLN
INTRAMUSCULAR | Status: AC
  Filled 2015-07-15: qty 2

## 2015-07-15 MED ORDER — HYDROMORPHONE HCL 1 MG/ML IJ SOLN
0.2500 mg | INTRAMUSCULAR | Status: DC | PRN
  Administered 2015-07-15 (×2): 0.5 mg via INTRAVENOUS

## 2015-07-15 MED ORDER — ONDANSETRON HCL 4 MG/2ML IJ SOLN
INTRAMUSCULAR | Status: AC
  Filled 2015-07-15: qty 2

## 2015-07-15 MED ORDER — PROPOFOL 10 MG/ML IV BOLUS
INTRAVENOUS | Status: AC
  Filled 2015-07-15: qty 40

## 2015-07-15 MED ORDER — DEXAMETHASONE SODIUM PHOSPHATE 10 MG/ML IJ SOLN
INTRAMUSCULAR | Status: AC
  Filled 2015-07-15: qty 1

## 2015-07-15 MED ORDER — BUPIVACAINE HCL (PF) 0.5 % IJ SOLN
INTRAMUSCULAR | Status: DC | PRN
  Administered 2015-07-15: 3 mL

## 2015-07-15 MED ORDER — POLYETHYLENE GLYCOL 3350 17 G PO PACK
17.0000 g | PACK | Freq: Two times a day (BID) | ORAL | Status: DC
  Administered 2015-07-15 – 2015-07-17 (×5): 17 g via ORAL

## 2015-07-15 MED ORDER — ASPIRIN EC 325 MG PO TBEC
325.0000 mg | DELAYED_RELEASE_TABLET | Freq: Two times a day (BID) | ORAL | Status: DC
  Administered 2015-07-16 – 2015-07-18 (×5): 325 mg via ORAL
  Filled 2015-07-15 (×7): qty 1

## 2015-07-15 MED ORDER — ACETAMINOPHEN 10 MG/ML IV SOLN
INTRAVENOUS | Status: AC
  Filled 2015-07-15: qty 100

## 2015-07-15 MED ORDER — DEXAMETHASONE SODIUM PHOSPHATE 10 MG/ML IJ SOLN
10.0000 mg | Freq: Once | INTRAMUSCULAR | Status: AC
  Administered 2015-07-15: 10 mg via INTRAVENOUS

## 2015-07-15 MED ORDER — MAGNESIUM CITRATE PO SOLN: 1.0000 | Freq: Once | ORAL | Status: DC | PRN

## 2015-07-15 MED ORDER — DARIFENACIN HYDROBROMIDE ER 15 MG PO TB24
15.0000 mg | ORAL_TABLET | Freq: Every day | ORAL | Status: DC
  Administered 2015-07-16 – 2015-07-18 (×3): 15 mg via ORAL
  Filled 2015-07-15 (×4): qty 1

## 2015-07-15 MED ORDER — METHOCARBAMOL 1000 MG/10ML IJ SOLN
500.0000 mg | Freq: Four times a day (QID) | INTRAVENOUS | Status: DC | PRN
  Administered 2015-07-15: 500 mg via INTRAVENOUS
  Filled 2015-07-15 (×2): qty 5

## 2015-07-15 MED ORDER — PHENYLEPHRINE HCL 10 MG/ML IJ SOLN
INTRAMUSCULAR | Status: DC | PRN
  Administered 2015-07-15: 80 ug via INTRAVENOUS
  Administered 2015-07-15: 40 ug via INTRAVENOUS
  Administered 2015-07-15 (×2): 80 ug via INTRAVENOUS

## 2015-07-15 MED ORDER — FENTANYL CITRATE (PF) 100 MCG/2ML IJ SOLN
25.0000 ug | INTRAMUSCULAR | Status: DC | PRN
  Administered 2015-07-15 (×3): 50 ug via INTRAVENOUS

## 2015-07-15 MED ORDER — CHLORHEXIDINE GLUCONATE 4 % EX LIQD: 60.0000 mL | Freq: Once | CUTANEOUS | Status: DC

## 2015-07-15 MED ORDER — LEVOTHYROXINE SODIUM 100 MCG PO TABS
100.0000 ug | ORAL_TABLET | Freq: Every day | ORAL | Status: DC
  Administered 2015-07-16 – 2015-07-18 (×3): 100 ug via ORAL
  Filled 2015-07-15 (×4): qty 1

## 2015-07-15 MED ORDER — BUPIVACAINE HCL (PF) 0.5 % IJ SOLN
INTRAMUSCULAR | Status: AC
  Filled 2015-07-15: qty 60

## 2015-07-15 MED ORDER — LIP MEDEX EX OINT
TOPICAL_OINTMENT | CUTANEOUS | Status: AC
  Filled 2015-07-15: qty 7

## 2015-07-15 MED ORDER — ONDANSETRON HCL 4 MG PO TABS: 4.0000 mg | ORAL_TABLET | Freq: Four times a day (QID) | ORAL | Status: DC | PRN

## 2015-07-15 MED ORDER — SODIUM CHLORIDE 0.9 % IV SOLN
100.0000 mL/h | INTRAVENOUS | Status: DC
  Administered 2015-07-15 – 2015-07-16 (×3): 100 mL/h via INTRAVENOUS
  Administered 2015-07-16: 10 mL/h via INTRAVENOUS
  Filled 2015-07-15 (×9): qty 1000

## 2015-07-15 MED ORDER — OLMESARTAN-AMLODIPINE-HCTZ 40-10-12.5 MG PO TABS: 1.0000 | ORAL_TABLET | Freq: Every morning | ORAL | Status: DC

## 2015-07-15 MED ORDER — FAMOTIDINE 40 MG PO TABS
40.0000 mg | ORAL_TABLET | Freq: Every day | ORAL | Status: DC
  Administered 2015-07-16 – 2015-07-18 (×3): 40 mg via ORAL
  Filled 2015-07-15 (×3): qty 1

## 2015-07-15 MED ORDER — SODIUM CHLORIDE 0.9 % IR SOLN
Status: DC | PRN
  Administered 2015-07-15: 1000 mL

## 2015-07-15 MED ORDER — FERROUS SULFATE 325 (65 FE) MG PO TABS
325.0000 mg | ORAL_TABLET | Freq: Three times a day (TID) | ORAL | Status: DC
  Administered 2015-07-16 – 2015-07-17 (×3): 325 mg via ORAL
  Filled 2015-07-15 (×11): qty 1

## 2015-07-15 MED ORDER — ALUM & MAG HYDROXIDE-SIMETH 200-200-20 MG/5ML PO SUSP: 30.0000 mL | ORAL | Status: DC | PRN

## 2015-07-15 MED ORDER — PHENYLEPHRINE 40 MCG/ML (10ML) SYRINGE FOR IV PUSH (FOR BLOOD PRESSURE SUPPORT)
PREFILLED_SYRINGE | INTRAVENOUS | Status: AC
  Filled 2015-07-15: qty 10

## 2015-07-15 MED ORDER — CELECOXIB 200 MG PO CAPS
200.0000 mg | ORAL_CAPSULE | Freq: Two times a day (BID) | ORAL | Status: DC
  Administered 2015-07-15 – 2015-07-18 (×6): 200 mg via ORAL
  Filled 2015-07-15 (×8): qty 1

## 2015-07-15 MED ORDER — 0.9 % SODIUM CHLORIDE (POUR BTL) OPTIME
TOPICAL | Status: DC | PRN
  Administered 2015-07-15: 1000 mL

## 2015-07-15 MED ORDER — HYDROCODONE-ACETAMINOPHEN 7.5-325 MG PO TABS
1.0000 | ORAL_TABLET | ORAL | Status: DC
  Administered 2015-07-15: 2 via ORAL
  Administered 2015-07-15: 1 via ORAL
  Administered 2015-07-16 (×4): 2 via ORAL
  Administered 2015-07-16: 1 via ORAL
  Administered 2015-07-16 – 2015-07-17 (×4): 2 via ORAL
  Administered 2015-07-17 (×2): 1 via ORAL
  Administered 2015-07-17 – 2015-07-18 (×4): 2 via ORAL
  Filled 2015-07-15: qty 2
  Filled 2015-07-15: qty 1
  Filled 2015-07-15 (×2): qty 2
  Filled 2015-07-15: qty 1
  Filled 2015-07-15 (×2): qty 2
  Filled 2015-07-15 (×2): qty 1
  Filled 2015-07-15 (×8): qty 2

## 2015-07-15 MED ORDER — METOCLOPRAMIDE HCL 5 MG PO TABS
5.0000 mg | ORAL_TABLET | Freq: Three times a day (TID) | ORAL | Status: DC | PRN
  Filled 2015-07-15: qty 2

## 2015-07-15 MED ORDER — AMLODIPINE BESYLATE 10 MG PO TABS
10.0000 mg | ORAL_TABLET | Freq: Once | ORAL | Status: AC
  Administered 2015-07-15: 10 mg via ORAL
  Filled 2015-07-15: qty 1

## 2015-07-15 MED ORDER — HYDROMORPHONE HCL 1 MG/ML IJ SOLN
0.5000 mg | INTRAMUSCULAR | Status: DC | PRN
  Administered 2015-07-15: 1 mg via INTRAVENOUS

## 2015-07-15 MED ORDER — FENTANYL CITRATE (PF) 100 MCG/2ML IJ SOLN
INTRAMUSCULAR | Status: AC
Start: 2015-07-15 — End: 2015-07-16
  Filled 2015-07-15: qty 2

## 2015-07-15 MED ORDER — ONDANSETRON HCL 4 MG/2ML IJ SOLN: 4.0000 mg | Freq: Four times a day (QID) | INTRAMUSCULAR | Status: DC | PRN

## 2015-07-15 MED ORDER — IRBESARTAN 300 MG PO TABS
300.0000 mg | ORAL_TABLET | Freq: Every day | ORAL | Status: DC
Start: 2015-07-16 — End: 2015-07-18
  Administered 2015-07-16 – 2015-07-18 (×3): 300 mg via ORAL
  Filled 2015-07-15 (×3): qty 1

## 2015-07-15 MED ORDER — ZOLPIDEM TARTRATE 5 MG PO TABS
5.0000 mg | ORAL_TABLET | Freq: Every day | ORAL | Status: DC
  Administered 2015-07-15 – 2015-07-17 (×3): 5 mg via ORAL
  Filled 2015-07-15 (×3): qty 1

## 2015-07-15 MED ORDER — METOCLOPRAMIDE HCL 5 MG/ML IJ SOLN: 5.0000 mg | Freq: Three times a day (TID) | INTRAMUSCULAR | Status: DC | PRN

## 2015-07-15 MED ORDER — CEFAZOLIN SODIUM-DEXTROSE 2-3 GM-% IV SOLR
2.0000 g | INTRAVENOUS | Status: AC
  Administered 2015-07-15: 2 g via INTRAVENOUS

## 2015-07-15 MED ORDER — PROMETHAZINE HCL 25 MG/ML IJ SOLN: 6.2500 mg | INTRAMUSCULAR | Status: DC | PRN

## 2015-07-15 MED ORDER — CEFAZOLIN SODIUM-DEXTROSE 2-3 GM-% IV SOLR
2.0000 g | Freq: Four times a day (QID) | INTRAVENOUS | Status: AC
  Administered 2015-07-15 (×2): 2 g via INTRAVENOUS
  Filled 2015-07-15 (×2): qty 50

## 2015-07-15 MED ORDER — TRANEXAMIC ACID 1000 MG/10ML IV SOLN
1000.0000 mg | Freq: Once | INTRAVENOUS | Status: AC
  Administered 2015-07-15: 1000 mg via INTRAVENOUS
  Filled 2015-07-15: qty 10

## 2015-07-15 MED ORDER — DOCUSATE SODIUM 100 MG PO CAPS
100.0000 mg | ORAL_CAPSULE | Freq: Two times a day (BID) | ORAL | Status: DC
  Administered 2015-07-15 – 2015-07-18 (×6): 100 mg via ORAL

## 2015-07-15 MED ORDER — ALBUTEROL SULFATE (2.5 MG/3ML) 0.083% IN NEBU: 3.0000 mL | INHALATION_SOLUTION | Freq: Four times a day (QID) | RESPIRATORY_TRACT | Status: DC | PRN

## 2015-07-15 MED ORDER — BISACODYL 10 MG RE SUPP: 10.0000 mg | Freq: Every day | RECTAL | Status: DC | PRN

## 2015-07-15 MED ORDER — ACETAMINOPHEN 10 MG/ML IV SOLN
1000.0000 mg | Freq: Once | INTRAVENOUS | Status: AC
  Administered 2015-07-15: 1000 mg via INTRAVENOUS

## 2015-07-15 MED ORDER — METHOCARBAMOL 500 MG PO TABS
500.0000 mg | ORAL_TABLET | Freq: Four times a day (QID) | ORAL | Status: DC | PRN
  Administered 2015-07-15 – 2015-07-18 (×5): 500 mg via ORAL
  Filled 2015-07-15 (×5): qty 1

## 2015-07-15 SURGICAL SUPPLY — 61 items
BAG DECANTER FOR FLEXI CONT (MISCELLANEOUS) IMPLANT
BAG SPEC THK2 15X12 ZIP CLS (MISCELLANEOUS) ×1
BAG ZIPLOCK 12X15 (MISCELLANEOUS) ×3 IMPLANT
BLADE SAW SGTL 18X1.27X75 (BLADE) ×2 IMPLANT
BLADE SAW SGTL 18X1.27X75MM (BLADE) ×1
BRUSH FEMORAL CANAL (MISCELLANEOUS) IMPLANT
CABLE (Orthopedic Implant) ×12 IMPLANT
DRAPE INCISE IOBAN 85X60 (DRAPES) ×3 IMPLANT
DRAPE ORTHO SPLIT 77X108 STRL (DRAPES) ×6
DRAPE POUCH INSTRU U-SHP 10X18 (DRAPES) ×3 IMPLANT
DRAPE SURG 17X11 SM STRL (DRAPES) ×3 IMPLANT
DRAPE SURG ORHT 6 SPLT 77X108 (DRAPES) ×2 IMPLANT
DRAPE U-SHAPE 47X51 STRL (DRAPES) ×3 IMPLANT
DRSG AQUACEL AG ADV 3.5X10 (GAUZE/BANDAGES/DRESSINGS) ×3 IMPLANT
DRSG AQUACEL AG ADV 3.5X14 (GAUZE/BANDAGES/DRESSINGS) IMPLANT
DURAPREP 26ML APPLICATOR (WOUND CARE) ×3 IMPLANT
ELECT BLADE TIP CTD 4 INCH (ELECTRODE) ×3 IMPLANT
ELECT REM PT RETURN 9FT ADLT (ELECTROSURGICAL) ×3
ELECTRODE REM PT RTRN 9FT ADLT (ELECTROSURGICAL) ×1 IMPLANT
FACESHIELD WRAPAROUND (MASK) ×12 IMPLANT
GAUZE SPONGE 2X2 8PLY STRL LF (GAUZE/BANDAGES/DRESSINGS) ×1 IMPLANT
GLOVE BIOGEL M 7.0 STRL (GLOVE) IMPLANT
GLOVE BIOGEL PI IND STRL 7.5 (GLOVE) ×1 IMPLANT
GLOVE BIOGEL PI IND STRL 8.5 (GLOVE) ×1 IMPLANT
GLOVE BIOGEL PI INDICATOR 7.5 (GLOVE) ×2
GLOVE BIOGEL PI INDICATOR 8.5 (GLOVE) ×2
GLOVE ECLIPSE 8.0 STRL XLNG CF (GLOVE) ×6 IMPLANT
GOWN STRL REUS W/TWL LRG LVL3 (GOWN DISPOSABLE) ×3 IMPLANT
GOWN STRL REUS W/TWL XL LVL3 (GOWN DISPOSABLE) ×6 IMPLANT
HANDPIECE INTERPULSE COAX TIP (DISPOSABLE)
HEAD CERAMIC 36 PLUS5 (Hips) ×3 IMPLANT
LIQUID BAND (GAUZE/BANDAGES/DRESSINGS) ×3 IMPLANT
MANIFOLD NEPTUNE II (INSTRUMENTS) ×3 IMPLANT
MARKER SKIN DUAL TIP RULER LAB (MISCELLANEOUS) ×3 IMPLANT
NDL SAFETY ECLIPSE 18X1.5 (NEEDLE) ×1 IMPLANT
NEEDLE HYPO 18GX1.5 SHARP (NEEDLE) ×3
NS IRRIG 1000ML POUR BTL (IV SOLUTION) ×6 IMPLANT
PADDING CAST COTTON 6X4 STRL (CAST SUPPLIES) ×3 IMPLANT
POSITIONER SURGICAL ARM (MISCELLANEOUS) ×3 IMPLANT
PRESSURIZER FEMORAL UNIV (MISCELLANEOUS) IMPLANT
SET HNDPC FAN SPRY TIP SCT (DISPOSABLE) IMPLANT
SPONGE GAUZE 2X2 STER 10/PKG (GAUZE/BANDAGES/DRESSINGS) ×2
SPONGE LAP 18X18 X RAY DECT (DISPOSABLE) IMPLANT
SPONGE LAP 4X18 X RAY DECT (DISPOSABLE) ×1 IMPLANT
STAPLER VISISTAT 35W (STAPLE) ×3 IMPLANT
STEM FEM CMNTLSS SM AML 15.0 (Hips) ×3 IMPLANT
SUCTION FRAZIER HANDLE 10FR (MISCELLANEOUS) ×2
SUCTION TUBE FRAZIER 10FR DISP (MISCELLANEOUS) ×1 IMPLANT
SUT STRATAFIX 0 PDS 27 VIOLET (SUTURE) ×3
SUT VIC AB 1 CT1 36 (SUTURE) ×3 IMPLANT
SUT VIC AB 2-0 CT1 27 (SUTURE) ×9
SUT VIC AB 2-0 CT1 TAPERPNT 27 (SUTURE) ×3 IMPLANT
SUT VLOC 180 0 24IN GS25 (SUTURE) IMPLANT
SUTURE STRATFX 0 PDS 27 VIOLET (SUTURE) ×1 IMPLANT
TOWEL OR 17X26 10 PK STRL BLUE (TOWEL DISPOSABLE) ×6 IMPLANT
TOWER CARTRIDGE SMART MIX (DISPOSABLE) IMPLANT
TRAY FOLEY W/METER SILVER 14FR (SET/KITS/TRAYS/PACK) ×3 IMPLANT
TRAY FOLEY W/METER SILVER 16FR (SET/KITS/TRAYS/PACK) IMPLANT
TUBE KAMVAC SUCTION (TUBING) IMPLANT
WATER STERILE IRR 1500ML POUR (IV SOLUTION) ×3 IMPLANT
YANKAUER SUCT BULB TIP 10FT TU (MISCELLANEOUS) ×3 IMPLANT

## 2015-07-15 NOTE — Interval H&P Note (Signed)
History and Physical Interval Note:  07/15/2015 8:21 AM  Margaret [name]  has presented today for surgery, with the diagnosis of right hip periprostethic fracture  The various methods of treatment have been discussed with the patient and family. After consideration of risks, benefits and other options for treatment, the patient has consented to  Procedure(s): RIGHT TOTAL LATERAL HIP REVISION (Right) as a surgical intervention .  The patient's history has been reviewed, patient examined, no change in status, stable for surgery.  I have reviewed the patient's chart and labs.  Questions were answered to the patient's satisfaction.     Mauri Pole

## 2015-07-15 NOTE — Anesthesia Postprocedure Evaluation (Signed)
Anesthesia Post Note  Patient: Margaret [name]  Procedure(s) Performed: Procedure(s) (LRB): OPEN REDUCTION RIGHT HIP STATUS POST TOTAL HIP (Right)  Patient location during evaluation: PACU Anesthesia Type: Spinal Level of consciousness: oriented and awake and alert Pain management: pain level controlled Vital Signs Assessment: post-procedure vital signs reviewed and stable Respiratory status: spontaneous breathing, respiratory function stable and patient connected to nasal cannula oxygen Cardiovascular status: blood pressure returned to baseline and stable Postop Assessment: no headache and no backache Anesthetic complications: no    Last Vitals:  Filed Vitals:   07/15/15 1537 07/15/15 1550  BP:  125/96  Pulse: 101 98  Temp:  36.2 C  Resp:  20    Last Pain:  Filed Vitals:   07/15/15 1550  PainSc: 7                  Montez Hageman

## 2015-07-15 NOTE — Brief Op Note (Signed)
07/15/2015  11:33 AM  PATIENT:  Margaret [name]  75 y.o. female  PRE-OPERATIVE DIAGNOSIS:  right hip periprostethic fracture  POST-OPERATIVE DIAGNOSIS:  right hip periprostethic fracture  PROCEDURE:  Procedure(s): Revision right total hip replacement, ORIF right peri-prosthetic proximal femur fracture  SURGEON:  Surgeon(s) and Role:    * Paralee Cancel, MD - Primary  PHYSICIAN ASSISTANT: Danae Orleans, PA-C  ANESTHESIA:   spinal  EBL:  Total I/O In: 2000 [I.V.:2000] Out: 600 [Urine:200; Blood:400]  BLOOD ADMINISTERED:none  DRAINS: none   LOCAL MEDICATIONS USED:  NONE  SPECIMEN:  No Specimen  DISPOSITION OF SPECIMEN:  N/A  COUNTS:  YES  TOURNIQUET:  * No tourniquets in log *  DICTATION: .Other Dictation: Dictation Number [id number]  PLAN OF CARE: Admit to inpatient   PATIENT DISPOSITION:  PACU - hemodynamically stable.   Delay start of Pharmacological VTE agent (>24hrs) due to surgical blood loss or risk of bleeding: no

## 2015-07-15 NOTE — Transfer of Care (Signed)
Immediate Anesthesia Transfer of Care Note  Patient: Margaret [name]  Procedure(s) Performed: Procedure(s): OPEN REDUCTION RIGHT HIP STATUS POST TOTAL HIP (Right)  Patient Location: PACU  Anesthesia Type:Spinal  Level of Consciousness: alert , oriented, patient cooperative and responds to stimulation  Airway & Oxygen Therapy: Patient Spontanous Breathing and Patient connected to face mask oxygen  Post-op Assessment: Report given to RN, Post -op Vital signs reviewed and stable and Patient moving all extremities X 4  Post vital signs: stable  Last Vitals:  Filed Vitals:   07/15/15 0702  BP: 133/55  Pulse: 94  Temp: 36.7 C  Resp: 16    Complications: No apparent anesthesia complications

## 2015-07-15 NOTE — Progress Notes (Signed)
Utilization review completed.  

## 2015-07-15 NOTE — Progress Notes (Signed)
RT placed patient on CPAP. Patient setting is 11 cmH2O. Sterile water was added tio water chamber for humidification. Patient is tolerating well. RT will continue to monitor.

## 2015-07-15 NOTE — Anesthesia Preprocedure Evaluation (Signed)
Anesthesia Evaluation  Patient identified by MRN, date of birth, ID band Patient awake    Reviewed: Allergy & Precautions, NPO status , Patient's Chart, lab work & pertinent test results  History of Anesthesia Complications Negative for: history of anesthetic complications  Airway Mallampati: II  TM Distance: >3 FB Neck ROM: Full    Dental  (+) Teeth Intact   Pulmonary sleep apnea ,    breath sounds clear to auscultation       Cardiovascular hypertension, Pt. on medications  Rhythm:Regular Rate:Normal     Neuro/Psych negative neurological ROS     GI/Hepatic Neg liver ROS,   Endo/Other  Hypothyroidism   Renal/GU negative Renal ROS     Musculoskeletal  (+) Arthritis ,   Abdominal   Peds  Hematology negative hematology ROS (+)   Anesthesia Other Findings   Reproductive/Obstetrics                             Anesthesia Physical  Anesthesia Plan  ASA: II  Anesthesia Plan: Spinal   Post-op Pain Management:    Induction:   Airway Management Planned: Natural Airway and Simple Face Mask  Additional Equipment:   Intra-op Plan:   Post-operative Plan:   Informed Consent: I have reviewed the patients History and Physical, chart, labs and discussed the procedure including the risks, benefits and alternatives for the proposed anesthesia with the patient or authorized representative who has indicated his/her understanding and acceptance.   Dental advisory given  Plan Discussed with: CRNA and Surgeon  Anesthesia Plan Comments:         Anesthesia Quick Evaluation

## 2015-07-15 NOTE — Anesthesia Procedure Notes (Signed)
Spinal Patient location during procedure: OR Start time: 07/15/2015 9:14 AM End time: 07/15/2015 9:20 AM Staffing Resident/CRNA: Freddie Breech Performed by: resident/CRNA  Preanesthetic Checklist Completed: patient identified, site marked, surgical consent, pre-op evaluation, timeout performed, IV checked, risks and benefits discussed and monitors and equipment checked Spinal Block Patient position: sitting Prep: ChloraPrep Patient monitoring: continuous pulse ox, heart rate and blood pressure Approach: midline Location: L3-4 Injection technique: single-shot Needle Needle type: Sprotte  Needle gauge: 24 G Needle length: 10 cm Assessment Sensory level: T8 Additional Notes Anesthesiologist at bedside.  Positive CSF on aspiration prior to injection of bupivacaine.

## 2015-07-16 ENCOUNTER — Encounter (HOSPITAL_COMMUNITY): Payer: Self-pay | Admitting: Orthopedic Surgery

## 2015-07-16 DIAGNOSIS — Z96649 Presence of unspecified artificial hip joint: Secondary | ICD-10-CM

## 2015-07-16 LAB — BASIC METABOLIC PANEL
Anion gap: 5 (ref 5–15)
BUN: 18 mg/dL (ref 6–20)
CALCIUM: 8.4 mg/dL — AB (ref 8.9–10.3)
CHLORIDE: 106 mmol/L (ref 101–111)
CO2: 26 mmol/L (ref 22–32)
CREATININE: 0.66 mg/dL (ref 0.44–1.00)
Glucose, Bld: 141 mg/dL — ABNORMAL HIGH (ref 65–99)
Potassium: 4.4 mmol/L (ref 3.5–5.1)
SODIUM: 137 mmol/L (ref 135–145)

## 2015-07-16 LAB — CBC
HCT: 24.2 % — ABNORMAL LOW (ref 36.0–46.0)
HEMOGLOBIN: 7.9 g/dL — AB (ref 12.0–15.0)
MCH: 29.7 pg (ref 26.0–34.0)
MCHC: 32.6 g/dL (ref 30.0–36.0)
MCV: 91 fL (ref 78.0–100.0)
Platelets: 244 10*3/uL (ref 150–400)
RBC: 2.66 MIL/uL — ABNORMAL LOW (ref 3.87–5.11)
RDW: 14.3 % (ref 11.5–15.5)
WBC: 10.8 10*3/uL — ABNORMAL HIGH (ref 4.0–10.5)

## 2015-07-16 NOTE — Evaluation (Signed)
Physical Therapy Evaluation Patient Details Name: Margaret [name] MRN: [id number] DOB: Dec 20, 1940 Today's Date: 07/16/2015   History of Present Illness  Pt s/p R THR revision and ORIF periprosthetic fx   Clinical Impression  Pt admitted as above and presenting with functional mobility limitations 2* decreased R LE strength/ROM, posterior THP, PWB status and post op pain.  Pt would benefit from follow up rehab at SNF level to maximize IND and safety prior to return home alone.    Follow Up Recommendations SNF    Equipment Recommendations  None recommended by PT    Recommendations for Other Services OT consult     Precautions / Restrictions Precautions Precautions: Posterior Hip;Fall Precaution Booklet Issued: Yes (comment) Precaution Comments: Sign hung on wall and THP reviewed x 2 Restrictions Weight Bearing Restrictions: Yes RLE Weight Bearing: Partial weight bearing RLE Partial Weight Bearing Percentage or Pounds: 50%      Mobility  Bed Mobility Overal bed mobility: Needs Assistance Bed Mobility: Supine to Sit     Supine to sit: Min assist     General bed mobility comments: cues for sequence, use of L LE to self assist and adherence to posterior THP  Transfers Overall transfer level: Needs assistance Equipment used: Rolling walker (2 wheeled) Transfers: Sit to/from Stand Sit to Stand: Min assist         General transfer comment: cues for LE management, THP and use of UEs to self assist  Ambulation/Gait Ambulation/Gait assistance: Min assist Ambulation Distance (Feet): 38 Feet Assistive device: Rolling walker (2 wheeled) Gait Pattern/deviations: Step-to pattern;Decreased step length - right;Decreased step length - left;Shuffle;Trunk flexed Gait velocity: decr   General Gait Details: cues for sequence, posture and position from [employer]            Wheelchair Mobility    Modified Rankin (Stroke Patients Only)       Balance                                              Pertinent Vitals/Pain Pain Assessment: 0-10 Pain Score: 5  Pain Location: R hip Pain Descriptors / Indicators: Aching;Sore Pain Intervention(s): Limited activity within patient's tolerance;Monitored during session;Premedicated before session;Ice applied    Home Living Family/patient expects to be discharged to:: Skilled nursing facility Living Arrangements: Alone                    Prior Function Level of Independence: Independent;Independent with assistive device(s)               Hand Dominance   Dominant Hand: Right    Extremity/Trunk Assessment   Upper Extremity Assessment: Overall WFL for tasks assessed           Lower Extremity Assessment: RLE deficits/detail RLE Deficits / Details: Strength at hip 2+/5 with AAROM at hip to 85 flex and 20 abd    Cervical / Trunk Assessment: Normal  Communication   Communication: No difficulties  Cognition Arousal/Alertness: Awake/alert Behavior During Therapy: WFL for tasks assessed/performed Overall Cognitive Status: Within Functional Limits for tasks assessed                      General Comments      Exercises Total Joint Exercises Ankle Circles/Pumps: AROM;Both;15 reps;Supine Quad Sets: AROM;Both;10 reps;Supine Heel Slides: AAROM;Right;20 reps;Supine Hip ABduction/ADduction: AAROM;Right;15 reps;Supine  Assessment/Plan    PT Assessment Patient needs continued PT services  PT Diagnosis Difficulty walking   PT Problem List Decreased strength;Decreased range of motion;Decreased activity tolerance;Decreased mobility;Decreased knowledge of use of DME;Pain;Decreased knowledge of precautions  PT Treatment Interventions DME instruction;Gait training;Stair training;Functional mobility training;Therapeutic activities;Therapeutic exercise;Patient/family education   PT Goals (Current goals can be found in the Care Plan section) Acute Rehab PT  Goals Patient Stated Goal: Regain IND PT Goal Formulation: With patient Time For Goal Achievement: 07/20/15 Potential to Achieve Goals: Good    Frequency 7X/week   Barriers to discharge        Co-evaluation               End of Session Equipment Utilized During Treatment: Gait belt Activity Tolerance: Patient tolerated treatment well Patient left: in chair;with call bell/phone within reach Nurse Communication: Mobility status         Time: 1100-1138 PT Time Calculation (min) (ACUTE ONLY): 38 min   Charges:   PT Evaluation $PT Eval Low Complexity: 1 Procedure PT Treatments $Gait Training: 8-22 mins $Therapeutic Exercise: 8-22 mins   PT G Codes:        Margaret [name] 07-27-2015, 12:48 PM

## 2015-07-16 NOTE — Progress Notes (Signed)
OT Cancellation Note and Discharge  Patient Details Name: Margaret [name] MRN: [id number] DOB: 10/05/1940   Cancelled Treatment:    Reason Eval/Treat Not Completed: Other (comment) Pt is Medicare and current D/C plan is SNF. No apparent immediate acute care OT needs, therefore will defer OT to SNF. If OT eval is needed please call Acute Rehab Dept. at [phone] or text page OT at [phone].    Almon Register [id number] 07/16/2015, 2:46 PM

## 2015-07-16 NOTE — Care Management Note (Signed)
Case Management Note  Patient Details  Name: Margaret [name] MRN: [id number] Date of Birth: [birth year]  Subjective/Objective:     S/p Revision of right total hip arthroplasty combined with open reduction and internal fixation of right periprosthetic fracture               Action/Plan: Discharge planning per CSW  Expected Discharge Date:                  Expected Discharge Plan:  Skilled Nursing Facility  In-House Referral:  Clinical Social Work  Discharge planning Services  CM Consult  Post Acute Care Choice:  NA Choice offered to:  NA  DME Arranged:  N/A DME Agency:  NA  HH Arranged:  NA HH Agency:  NA  Status of Service:  Completed, signed off  Medicare Important Message Given:    Date Medicare IM Given:    Medicare IM give by:    Date Additional Medicare IM Given:    Additional Medicare Important Message give by:     If discussed at [place] of Stay Meetings, dates discussed:    Additional Comments:  Guadalupe Maple, RN 07/16/2015, 10:47 AM

## 2015-07-16 NOTE — NC FL2 (Signed)
[place] LEVEL OF CARE SCREENING TOOL     IDENTIFICATION  Patient Name: Margaret [name] Birthdate: 01/06/1941 Sex: female Admission Date (Current Location): 07/15/2015  [facility] and [place] Number:  Herbalist and Address:  [facility],  [place] [address], [place]      Provider Number: [id number]  Attending Physician Name and Address:  Paralee Cancel, MD  Relative Name and Phone Number:       Current Level of Care: Hospital Recommended Level of Care: [place] Prior Approval Number:    Date Approved/Denied:   PASRR Number: [id number]  Discharge Plan: SNF    Current Diagnoses: Patient Active Problem List   Diagnosis Date Noted  . S/P righ TH revision 07/16/2015  . Overweight (BMI 25.0-29.9) 03/21/2015  . S/P right THA, AA 03/19/2015  . Cough 09/22/2013  . Osteopenia 08/07/2013  . GERD (gastroesophageal reflux disease) 06/14/2013  . Hyperglycemia 02/03/2013  . Xerostomia 02/03/2013  . Obstructive sleep apnea 09/06/2012  . Squamous cell skin cancer 04/19/2012  . PULMONARY FIBROSIS, POSTINFLAMMATORY 10/17/2007  . 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 Hypothyroidism 09/02/2006  . HYPERCHOLESTEROLEMIA 09/02/2006  . OSTEOARTHRITIS, MULTI SITES 09/02/2006    Orientation RESPIRATION BLADDER Height & Weight    Self, Time, Situation, Place  Normal Continent 5\' 4"  (162.6 cm) 161 lbs.  BEHAVIORAL SYMPTOMS/MOOD NEUROLOGICAL BOWEL NUTRITION STATUS  Other (Comment) (No Behaviors)   Continent Diet  AMBULATORY STATUS COMMUNICATION OF NEEDS Skin   Limited Assist Verbally Surgical wounds                       Personal Care Assistance Level of Assistance  Bathing, Feeding, Dressing Bathing Assistance: Limited assistance Feeding assistance: Independent Dressing Assistance: Limited assistance     Functional Limitations Info  Sight, Hearing, Speech Sight Info: Adequate Hearing Info:  Adequate Speech Info: Adequate    SPECIAL CARE FACTORS FREQUENCY  PT (By licensed PT), OT (By licensed OT)     PT Frequency: 5 x wk OT Frequency: 5 x wk            Contractures Contractures Info: Not present    Additional Factors Info  Code Status Code Status Info: Full Code             Current Medications (07/16/2015):  This is the current hospital active medication list Current Facility-Administered Medications  Medication Dose Route Frequency Provider Last Rate Last Dose  . albuterol (PROVENTIL) (2.5 MG/3ML) 0.083% nebulizer solution 3 mL  3 mL Inhalation Q6H PRN Danae Orleans, PA-C      . alum & mag hydroxide-simeth (MAALOX/MYLANTA) 200-200-20 MG/5ML suspension 30 mL  30 mL Oral Q4H PRN Danae Orleans, PA-C      . irbesartan (AVAPRO) tablet 300 mg  300 mg Oral Daily Paralee Cancel, MD   300 mg at 07/16/15 1038   And  . amLODipine (NORVASC) tablet 10 mg  10 mg Oral Daily Paralee Cancel, MD   10 mg at 07/16/15 1038   And  . hydrochlorothiazide (MICROZIDE) capsule 12.5 mg  12.5 mg Oral Daily Paralee Cancel, MD   12.5 mg at 07/16/15 1039  . aspirin EC tablet 325 mg  325 mg Oral BID Danae Orleans, PA-C   325 mg at 07/16/15 0800  . atorvastatin (LIPITOR) tablet 20 mg  20 mg Oral QPM Danae Orleans, PA-C   20 mg at 07/15/15 2227  . bisacodyl (DULCOLAX) suppository 10 mg  10 mg  Rectal Daily PRN Danae Orleans, PA-C      . celecoxib (CELEBREX) capsule 200 mg  200 mg Oral Q12H Danae Orleans, PA-C   200 mg at 07/16/15 1038  . darifenacin (ENABLEX) 24 hr tablet 15 mg  15 mg Oral Daily Danae Orleans, PA-C   15 mg at 07/16/15 1038  . diphenhydrAMINE (BENADRYL) capsule 25 mg  25 mg Oral Q6H PRN Danae Orleans, PA-C      . docusate sodium (COLACE) capsule 100 mg  100 mg Oral BID Danae Orleans, PA-C   100 mg at 07/16/15 0800  . famotidine (PEPCID) tablet 40 mg  40 mg Oral Daily Danae Orleans, PA-C   40 mg at 07/16/15 1038  . ferrous sulfate tablet 325 mg  325 mg Oral TID PC Danae Orleans, PA-C   325 mg at 07/16/15 0800  . HYDROcodone-acetaminophen (NORCO) 7.5-325 MG per tablet 1-2 tablet  1-2 tablet Oral Q4H Danae Orleans, PA-C   2 tablet at 07/16/15 1209  . HYDROmorphone (DILAUDID) injection 0.5-1 mg  0.5-1 mg Intravenous Q2H PRN Danae Orleans, PA-C   1 mg at 07/15/15 1330  . levothyroxine (SYNTHROID, LEVOTHROID) tablet 100 mcg  100 mcg Oral QAC breakfast Danae Orleans, PA-C   100 mcg at 07/16/15 0800  . magnesium citrate solution 1 Bottle  1 Bottle Oral Once PRN Danae Orleans, PA-C      . menthol-cetylpyridinium (CEPACOL) lozenge 3 mg  1 lozenge Oral PRN Danae Orleans, PA-C       Or  . phenol (CHLORASEPTIC) mouth spray 1 spray  1 spray Mouth/Throat PRN Danae Orleans, PA-C      . methocarbamol (ROBAXIN) tablet 500 mg  500 mg Oral Q6H PRN Danae Orleans, PA-C   500 mg at 07/15/15 2227   Or  . methocarbamol (ROBAXIN) 500 mg in dextrose 5 % 50 mL IVPB  500 mg Intravenous Q6H PRN Danae Orleans, PA-C   500 mg at 07/15/15 1230  . metoCLOPramide (REGLAN) tablet 5-10 mg  5-10 mg Oral Q8H PRN Danae Orleans, PA-C       Or  . metoCLOPramide (REGLAN) injection 5-10 mg  5-10 mg Intravenous Q8H PRN Danae Orleans, PA-C      . ondansetron [facility]) tablet 4 mg  4 mg Oral Q6H PRN Danae Orleans, PA-C       Or  . ondansetron [facility]) injection 4 mg  4 mg Intravenous Q6H PRN Danae Orleans, PA-C      . polyethylene glycol (MIRALAX / GLYCOLAX) packet 17 g  17 g Oral BID Danae Orleans, PA-C   17 g at 07/16/15 0759  . sodium chloride 0.9 % 1,000 mL with potassium chloride 10 mEq infusion  100 mL/hr Intravenous Continuous Danae Orleans, PA-C   Stopped at 07/16/15 1230  . zolpidem (AMBIEN) tablet 5 mg  5 mg Oral QHS Danae Orleans, PA-C   5 mg at 07/15/15 2227     Discharge Medications: Please see discharge summary for a list of discharge medications.  Relevant Imaging Results:  Relevant Lab Results:   Additional Information SS# [id number]. Pt uses CPAP at  night.  Hargun Spurling, Randall An, LCSW

## 2015-07-16 NOTE — Clinical Social Work Placement (Signed)
   CLINICAL SOCIAL WORK PLACEMENT  NOTE  Date:  07/16/2015  Patient Details  Name: Margaret [name] MRN: [id number] Date of Birth: June 14, 1941  Clinical Social Work is seeking post-discharge placement for this patient at the [place] level of care (*CSW will initial, date and re-position this form in  chart as items are completed):  No   Patient/family provided with [facility] Work Department's list of facilities offering this level of care within the geographic area requested by the patient (or if unable, by the patient's family).  Yes   Patient/family informed of their freedom to choose among providers that offer the needed level of care, that participate in Medicare, Medicaid or managed care program needed by the patient, have an available bed and are willing to accept the patient.  Yes   Patient/family informed of [facility]'s ownership interest in [facility] and [facility], as well as of the fact that they are under no obligation to receive care at these facilities.  PASRR submitted to EDS on       PASRR number received on       Existing PASRR number confirmed on 07/16/15     FL2 transmitted to all facilities in geographic area requested by pt/family on 07/16/15     FL2 transmitted to all facilities within larger geographic area on       Patient informed that his/her managed care company has contracts with or will negotiate with certain facilities, including the following:            Patient/family informed of bed offers received.  Patient chooses bed at [facility]     Physician recommends and patient chooses bed at [facility]    Patient to be transferred to [facility] on  .  Patient to be transferred to facility by       Patient family notified on   of transfer.  Name of family member notified:        PHYSICIAN       Additional Comment:    _______________________________________________ Luretha Rued,  [place] 07/16/2015, 2:53 PM

## 2015-07-16 NOTE — Op Note (Signed)
Margaret [name] NO.:  [id number]  MEDICAL RECORD NO.:  [id number]  LOCATION:  [id number]                         FACILITY:  [facility]  PHYSICIAN:  Pietro Cassis. Alvan Dame, M.D.  DATE OF BIRTH:  Dec 10, 1940  DATE OF PROCEDURE:  07/15/2015 DATE OF DISCHARGE:                              OPERATIVE REPORT   PREOPERATIVE DIAGNOSIS:  Periprosthetic right proximal femur fracture, status post right total hip arthroplasty with subsidence of femoral component.  POSTOPERATIVE DIAGNOSIS:  Periprosthetic right proximal femur fracture, status post right total hip arthroplasty with subsidence of femoral component.  PROCEDURE:  Revision of right total hip arthroplasty combined with open reduction and internal fixation of right periprosthetic fracture.  COMPONENTS USED:  The size 15 small AML prosthesis with a 36+ 5 Delta ceramic ball and four Zimmer cables.  SURGEON:  Pietro Cassis. Alvan Dame, M.D.  ASSISTANT:  Danae Orleans, PA-C.  Note that Mr. Margaret [name] was present for the entirety of the case from preoperative position, perioperative management of the operative extremity and general facilitation of the case and primary wound closure.  ANESTHESIA:  Spinal.  BLOOD LOSS:  400 mL.  DRAINS:  None.  COMPLICATION:  None.  INDICATIONS FOR PROCEDURE:  Margaret [name] is a pleasant 75 year old patient of mine with a history of right total hip arthroplasty in the fall of 2016.  She lives in the [place] and it come up for her 6-week visit stating that she had been doing fairly well, but still having some problems with some weakness and function.  Routine radiographs at that time indicated that she had a periprosthetic fracture.  She could not recall the exact events, but had committed about certain activities that do not have any specific falls or injuries.  She did not have any specific hip pain that correlated with her injury. Nonetheless, we determined based on the time and year and her  tolerance of how it was proceeding that she would not have to have her surgery immediately.  Discussed that she would require open revision of the hip to restore her leg lengths as well as open reduction and internal fixation to restore the anatomy of the proximal femur.  The risks of infection discussed in this setting of periprosthetic fracture, the delay in surgery was beneficial to prevent the sometimes increased risk for infection following immediate surgery in that setting.  Consent was obtained for benefit of pain relief and function improvement.  PROCEDURE IN DETAIL:  The patient was brought to the operative theater. Once adequate anesthesia, preop antibiotics, Ancef administered, she was positioned into the left lateral decubitus position with the right side up.  The right lower extremity was then prepped and draped in usual sterile fashion.  Time-out was performed identifying the patient, planned procedure and extremity.  Through the lateral position, we performed a posterior approach to the hip as well as discussed with the patient and posterior approach was made over the greater trochanter and allowed for exposure of the proximal femur.  The iliotibial band was split in this length as well as the gluteal fascia for posterior approach to the hip.  Following initial exposure of the posterior aspect of the hip by taking down  the short external rotators, the hip was dislocated and the femoral head was removed.  The acetabular cup appeared to be anatomically positioned. Thus, it was not revised.  At this point, the proximal femur was exposed, identifying that the posteromedial aspect of the femur had fractured off with medialization as well as of the fragment related to the pull predominately of the iliopsoas muscle.  Following initial exposure of the proximal femur, our first attention was removing the femoral component.  I used thin osteotomes; one around the proximal  aspect of the femur, circumferentially then used a slap hammer and removed the femoral component without further complications.  At this point with the posterior aspect of the hip exposed, I mobilized the soft tissues around the fractured posteromedial aspect of the femur with the goal to maintain the anatomy of her iliopsoas tendon on the lesser trochanter.  Once the soft tissues were mobilized, we began preparation of the femur for a final component and we selected to use an AML prosthesis as it was going to bypass the fracture site without adequately for distal fixation in the proximal femur.  I began reaming with a 9-mm reamer and reamed up sequentially initially by 1 mm increments to 13 and then by 0.5 mm increments.  It was not until the 14 and 14.5-mm reamers that get some cortical bone and thus selected the 15-mm stem.  Trial reduction was carried out for orientation purposes for leg lengths as well as to demarcate on the proximal femoral bone for final component placement.  Once this was done, the final 15-mm small body femur was opened.  It was then impacted, maintaining anteversion about 20-25 degrees utilizing a reamer handle.  It was impacted to a level where I demarcated on the greater trochanter. We then placed a trial 36 1.5 ball, found that the leg lengths were little bit short on this side, so, I went to a +5 ball, restoring leg lengths.  Kept the trial ball in place until I was able to further mobilize the soft tissues in a near anatomic position around the proximal femoral stem.  Once this was done, I went ahead and placed the final 36+ 5 Delta ceramic ball on the clean and dry trunnion and reduced the hip.  At this point, attention was at reducing and fixing the fracture segment.  Four cables were passed and placed one cable proximal to the lesser trochanter and one distal and then two other cables around the more distal segment of fracture.  At this point, the  cable seemed to have restored this to near anatomic position.  Again, the most important aspect to me was to getting the iliopsoas back to function for hip flexion.  Following this, the hip was irrigated with normal saline solution with the pulse lavage.  We began closing using #1 Vicryl on the posterior pseudocapsule back to the superior leaflet and then carried this down to proximal over the distal proximal femur sewing the vastus lateralis back to itself, some of this with displacement of the fractures itself.  The iliotibial band and gluteal fascia were then reapproximated using #1 Vicryl and 0 STRATAFIX suture.  The remainder of the wound was closed with 2-0 Vicryl and running 3-0 Monocryl.  The hip was then cleaned, dried and dressed sterilely using surgical glue and Aquacel dressing. She was brought to the recovery room in stable condition tolerating the procedure well.  Findings were reviewed with her son.  We will have her be  partial weightbearing for probably 4-6 weeks after bony healing.  We will follow up with her radiographically in 2 weeks before she has back home and then in 6 weeks.     Pietro Cassis Alvan Dame, M.D.     MDO/MEDQ  D:  07/15/2015  T:  07/16/2015  Job:  [id number]

## 2015-07-16 NOTE — Clinical Social Work Note (Signed)
Clinical Social Work Assessment  Patient Details  Name: Margaret [name] MRN: [id number] Date of Birth: 10/01/1940  Date of referral:  07/16/15               Reason for consult:  Facility Placement, Discharge Planning                Permission sought to share information with:  Chartered certified accountant granted to share information::  Yes, Verbal Permission Granted  Name::        Agency::     Relationship::     Contact Information:     Housing/Transportation Living arrangements for the past 2 months:  Single Family Home Source of Information:  Patient Patient Interpreter Needed:  None Criminal Activity/Legal Involvement Pertinent to Current Situation/Hospitalization:  No - Comment as needed Significant Relationships:  Adult Children, Siblings Lives with:  Self Do you feel safe going back to the place where you live?  No (SNF placement needed.) Need for family participation in patient care:  No (Coment)  Care giving concerns:  Pt's care cannot be managed at ome following hospital d/c.   Social Worker assessment / plan:  Pt hospitalized on 07/15/15 for pre planned right total hip revision. CSW met with pt to assist with d/c planning. PT as recommended [facility] Re[facility] at d/c. Pt has made prior arrangements to have rehab at [facility] at d/c. CSW has contacted SNF and d/c plans have been confirmed. CSW will continue to follow to assist with d/c planning to SNF. Pt is no longer part of the medicare bundle program.  Employment status:  Retired Forensic scientist:  Medicare PT Recommendations:  [place] / Referral to community resources:  [place]  Patient/Family's Response to care: Pt feels ST Rehab is needed.  Patient/Family's Understanding of and Emotional Response to Diagnosis, Current Treatment, and Prognosis:  Pt is aware of her medical status. She is motivated to work with PT and is looking forward to having rehab at [facility].  Emotional Assessment Appearance:  Appears stated age Attitude/Demeanor/Rapport:  Other (cooperative) Affect (typically observed):  Calm, Pleasant Orientation:  Oriented to Self, Oriented to Place, Oriented to  Time, Oriented to Situation Alcohol / Substance use:  Not Applicable Psych involvement (Current and /or in the community):  No (Comment)  Discharge Needs  Concerns to be addressed:  Discharge Planning Concerns Readmission within the last 30 days:  No Current discharge risk:  None Barriers to Discharge:  No Barriers Identified   Luretha Rued, [place] 07/16/2015, 2:48 PM

## 2015-07-16 NOTE — Progress Notes (Signed)
Patient ID: Margaret [name], female   DOB: 11-11-1940, 75 y.o.   MRN: [id number] Subjective: 1 Day Post-Op Procedure(s) (LRB): OPEN REDUCTION RIGHT HIP STATUS POST TOTAL HIP (Right)    Patient reports pain as moderate.  But sleeping comfortably this am  Objective:   VITALS:   Filed Vitals:   07/16/15 0615 07/16/15 0622  BP: 107/42   Pulse: 64 63  Temp: 97.8 F (36.6 C)   Resp: 15 15    Neurovascular intact Incision: dressing C/D/I  LABS  Recent Labs  07/16/15 0520  HGB 7.9*  HCT 24.2*  WBC 10.8*  PLT 244     Recent Labs  07/16/15 0520  NA 137  K 4.4  BUN 18  CREATININE 0.66  GLUCOSE 141*    No results for input(s): LABPT, INR in the last 72 hours.   Assessment/Plan: 1 Day Post-Op Procedure(s) (LRB): OPEN REDUCTION RIGHT HIP STATUS POST TOTAL HIP (Right)   Advance diet Up with therapy Discharge to SNF when stable earliest Wednesday versus Thursday pending ABLA status an d activity level  ABLA - at this will monitor and treat with fluid bolus and check labs in am  Iron supplement  RTC Friday 1/20

## 2015-07-16 NOTE — Progress Notes (Signed)
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  Follow Up Recommendations  SNF     Equipment Recommendations  None recommended by PT    Recommendations for Other Services OT consult     Precautions / Restrictions Precautions Precautions: Posterior Hip;Fall Precaution Booklet Issued: Yes (comment) Precaution Comments: Pt recalls all THP with increased time Restrictions Weight Bearing Restrictions: Yes RLE Weight Bearing: Partial weight bearing RLE Partial Weight Bearing Percentage or Pounds: 50%    Mobility  Bed Mobility Overal bed mobility: Needs Assistance Bed Mobility: Supine to Sit     Supine to sit: Min assist     General bed mobility comments: Pt requests back to chair  Transfers Overall transfer level: Needs assistance Equipment used: Rolling walker (2 wheeled) Transfers: Sit to/from Stand Sit to Stand: Min assist         General transfer comment: cues for LE management, THP and use of UEs to self assist  Ambulation/Gait Ambulation/Gait assistance: Min assist;Min guard Ambulation Distance (Feet): 50 Feet Assistive device: Rolling walker (2 wheeled) Gait Pattern/deviations: Step-to pattern;Decreased step length - right;Decreased step length - left;Shuffle;Trunk flexed Gait velocity: decr   General Gait Details: cues for sequence, posture, position from RW, and PWB   Stairs            Wheelchair Mobility    Modified Rankin (Stroke Patients Only)       Balance                                    Cognition Arousal/Alertness: Awake/alert Behavior During Therapy: WFL for tasks assessed/performed Overall Cognitive Status: Within Functional Limits for tasks assessed                       Exercises Total Joint Exercises Ankle Circles/Pumps: AROM;Both;15 reps;Supine Quad Sets: AROM;Both;10 reps;Supine Heel Slides: AAROM;Right;20 reps;Supine Hip ABduction/ADduction: AAROM;Right;15 reps;Supine    General Comments        Pertinent Vitals/Pain Pain Assessment: 0-10 Pain Score: 6  Pain Location: R hip Pain Descriptors / Indicators: Aching;Sore Pain Intervention(s): Limited activity within patient's tolerance;Monitored during session;Premedicated before session;Ice applied    Home Living Family/patient expects to be discharged to:: Skilled nursing facility Living Arrangements: Alone                  Prior Function Level of Independence: Independent;Independent with assistive device(s)          PT Goals (current goals can now be found in the care plan section) Acute Rehab PT Goals Patient Stated Goal: Regain IND PT Goal Formulation: With patient Time For Goal Achievement: 07/20/15 Potential to Achieve Goals: Good Progress towards PT goals: Progressing toward goals    Frequency  7X/week    PT Plan Current plan remains appropriate    Co-evaluation             End of Session Equipment Utilized During Treatment: Gait belt Activity Tolerance: Patient tolerated treatment well Patient left: in chair;with call bell/phone within reach     Time: 1317-1336 PT Time Calculation (min) (ACUTE ONLY): 19 min  Charges:  $Gait Training: 8-22 mins $Therapeutic Exercise: 8-22  mins                    G Codes:      Cohen Doleman 08/12/15, 1:39 PM

## 2015-07-17 DIAGNOSIS — D62 Acute posthemorrhagic anemia: Secondary | ICD-10-CM | POA: Diagnosis not present

## 2015-07-17 LAB — BASIC METABOLIC PANEL
Anion gap: 5 (ref 5–15)
BUN: 20 mg/dL (ref 6–20)
CALCIUM: 8.3 mg/dL — AB (ref 8.9–10.3)
CHLORIDE: 103 mmol/L (ref 101–111)
CO2: 27 mmol/L (ref 22–32)
CREATININE: 0.71 mg/dL (ref 0.44–1.00)
Glucose, Bld: 145 mg/dL — ABNORMAL HIGH (ref 65–99)
Potassium: 4.4 mmol/L (ref 3.5–5.1)
SODIUM: 135 mmol/L (ref 135–145)

## 2015-07-17 LAB — CBC
HCT: 21.2 % — ABNORMAL LOW (ref 36.0–46.0)
Hemoglobin: 7 g/dL — ABNORMAL LOW (ref 12.0–15.0)
MCH: 29.4 pg (ref 26.0–34.0)
MCHC: 33 g/dL (ref 30.0–36.0)
MCV: 89.1 fL (ref 78.0–100.0)
PLATELETS: 211 10*3/uL (ref 150–400)
RBC: 2.38 MIL/uL — ABNORMAL LOW (ref 3.87–5.11)
RDW: 14.3 % (ref 11.5–15.5)
WBC: 11.6 10*3/uL — AB (ref 4.0–10.5)

## 2015-07-17 LAB — PREPARE RBC (CROSSMATCH)

## 2015-07-17 MED ORDER — SODIUM CHLORIDE 0.9 % IV SOLN: Freq: Once | INTRAVENOUS | Status: DC

## 2015-07-17 NOTE — Progress Notes (Signed)
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     PT Comments    Pt progressing well with mobility and with good awareness of THP and PWB  Follow Up Recommendations  SNF     Equipment Recommendations  None recommended by PT    Recommendations for Other Services OT consult     Precautions / Restrictions Precautions Precautions: Posterior Hip;Fall Precaution Comments: Pt recalls all THP without cues Restrictions RLE Weight Bearing: Partial weight bearing RLE Partial Weight Bearing Percentage or Pounds: 50%    Mobility  Bed Mobility               General bed mobility comments: Pt declines back to bed  Transfers Overall transfer level: Needs assistance Equipment used: Rolling walker (2 wheeled) Transfers: Sit to/from Stand Sit to Stand: Min assist         General transfer comment: cues for LE management, THP and use of UEs to self assist  Ambulation/Gait Ambulation/Gait assistance: Min assist;Min guard Ambulation Distance (Feet): 50 Feet (twice) Assistive device: Rolling walker (2 wheeled) Gait Pattern/deviations: Step-to pattern;Decreased step length - right;Decreased step length - left;Shuffle;Trunk flexed Gait velocity: decr   General Gait Details: min cues for sequence, posture, position from RW, and PWB   Stairs            Wheelchair Mobility    Modified Rankin (Stroke Patients Only)       Balance                                    Cognition Arousal/Alertness: Awake/alert Behavior During Therapy: WFL for tasks assessed/performed Overall Cognitive Status: Within Functional Limits for tasks assessed                      Exercises      General Comments        Pertinent Vitals/Pain Pain Assessment: 0-10 Pain Score: 4  Pain Location: R hip Pain  Descriptors / Indicators: Aching;Sore Pain Intervention(s): Limited activity within patient's tolerance;Monitored during session;Premedicated before session;Ice applied    Home Living                      Prior Function            PT Goals (current goals can now be found in the care plan section) Acute Rehab PT Goals Patient Stated Goal: Regain IND PT Goal Formulation: With patient Time For Goal Achievement: 07/20/15 Potential to Achieve Goals: Good Progress towards PT goals: Progressing toward goals    Frequency  7X/week    PT Plan Current plan remains appropriate    Co-evaluation             End of Session Equipment Utilized During Treatment: Gait belt Activity Tolerance: Patient tolerated treatment well Patient left: in chair;with call bell/phone within reach     Time: 1356-1410 PT Time Calculation (min) (ACUTE ONLY): 14 min  Charges:  $Gait Training: 8-22 mins                    G Codes:      Margaret [name] 2015-07-22, 3:54 PM

## 2015-07-17 NOTE — Progress Notes (Signed)
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    Follow Up Recommendations  SNF     Equipment Recommendations  None recommended by PT    Recommendations for Other Services OT consult     Precautions / Restrictions Precautions Precautions: Posterior Hip;Fall Precaution Booklet Issued: Yes (comment) Precaution Comments: Pt recalls all THP with increased time Restrictions Weight Bearing Restrictions: Yes RLE Weight Bearing: Partial weight bearing RLE Partial Weight Bearing Percentage or Pounds: 50%    Mobility  Bed Mobility Overal bed mobility: Needs Assistance Bed Mobility: Supine to Sit     Supine to sit: Min assist     General bed mobility comments: cues for sequence, use of L LE to self assist and adherence to THP  Transfers Overall transfer level: Needs assistance Equipment used: Rolling walker (2 wheeled) Transfers: Sit to/from Stand Sit to Stand: Min assist         General transfer comment: cues for LE management, THP and use of UEs to self assist  Ambulation/Gait Ambulation/Gait assistance: Min assist;Min guard Ambulation Distance (Feet): 44 Feet Assistive device: Rolling walker (2 wheeled) Gait Pattern/deviations: Step-to pattern;Shuffle;Trunk flexed Gait velocity: decr   General Gait Details: cues for sequence, posture, position from RW, and PWB   Stairs            Wheelchair Mobility    Modified Rankin (Stroke Patients Only)       Balance                                    Cognition Arousal/Alertness: Awake/alert Behavior During Therapy: WFL for tasks assessed/performed Overall Cognitive Status: Within Functional Limits for tasks assessed                      Exercises Total  Joint Exercises Ankle Circles/Pumps: AROM;Both;15 reps;Supine Quad Sets: AROM;Both;10 reps;Supine Heel Slides: AAROM;Right;20 reps;Supine Hip ABduction/ADduction: AAROM;Right;15 reps;Supine    General Comments        Pertinent Vitals/Pain Pain Assessment: 0-10 Pain Score: 4  Pain Location: R hip Pain Descriptors / Indicators: Aching;Sore Pain Intervention(s): Limited activity within patient's tolerance;Monitored during session;Premedicated before session;Ice applied    Home Living                      Prior Function            PT Goals (current goals can now be found in the care plan section) Acute Rehab PT Goals Patient Stated Goal: Regain IND PT Goal Formulation: With patient Time For Goal Achievement: 07/20/15 Potential to Achieve Goals: Good Progress towards PT goals: Progressing toward goals    Frequency  7X/week    PT Plan Current plan remains appropriate    Co-evaluation             End of Session Equipment Utilized During Treatment: Gait belt Activity Tolerance: Patient tolerated treatment well Patient left: in chair;with call bell/phone within reach;with family/visitor present     Time: 0905-0928 PT Time Calculation (min) (ACUTE ONLY): 23 min  Charges:  $Gait Training: 8-22 mins $Therapeutic Exercise: 8-22 mins  G Codes:      Margaret [name] 07-19-15, 10:51 AM

## 2015-07-17 NOTE — Clinical Documentation Improvement (Addendum)
Orthopedic  Can the cause  of "Periprosthetic right proximal femur fracture" be further specified if appropriate for this admission? Thank you    Cause : Pathological  Cause: Stress  Other  Clinically Undetermined    Supporting Information: H/o of malignant breast/skin cancer with radiation and chemotherapy treatment   Please exercise your independent, professional judgment when responding. A specific answer is not anticipated or expected.   Thank You,  [place] [phone]

## 2015-07-17 NOTE — Progress Notes (Signed)
     Subjective: 2 Days Post-Op Procedure(s) (LRB): OPEN REDUCTION RIGHT HIP STATUS POST TOTAL HIP (Right)   Patient reports pain as mild, pain controlled. No events throughout the night.  We have discussed how she is progressing and her hgb levels. We have discussed giving her blood to help with energy and help in the encouraging healing. After discussing the risks, she states that she is ok with receiving blood.  Still planning on SNF upon discharge, possibly tomorrow if she feels good after receiving blood today.  Objective:   VITALS:   Filed Vitals:   07/17/15 0439 07/17/15 0906  BP: 123/44 120/63  Pulse: 75   Temp: 98.2 F (36.8 C)   Resp: 18     Dorsiflexion/Plantar flexion intact Incision: dressing C/D/I No cellulitis present Compartment soft  LABS  Recent Labs  07/16/15 0520 07/17/15 0423  HGB 7.9* 7.0*  HCT 24.2* 21.2*  WBC 10.8* 11.6*  PLT 244 211     Recent Labs  07/16/15 0520 07/17/15 0423  NA 137 135  K 4.4 4.4  BUN 18 20  CREATININE 0.66 0.71  GLUCOSE 141* 145*     Assessment/Plan: 2 Days Post-Op Procedure(s) (LRB): OPEN REDUCTION RIGHT HIP STATUS POST TOTAL HIP (Right) Up with therapy Discharge to SNF eventually, when ready  ABLA  She will receive 2 unit of blood today.  Continue treatment with iron and will evaluate again tomorrow.  Overweight (BMI 25-29.9) Estimated body mass index is 27.62 kg/(m^2) as calculated from the following:   Height as of this encounter: 5\' 4"  (1.626 m).   Weight as of this encounter: 73.029 kg (161 lb). Patient also counseled that weight may inhibit the healing process Patient counseled that losing weight will help with future health issues       [place]. Rya Rausch   PAC  07/17/2015, 9:40 AM

## 2015-07-18 ENCOUNTER — Other Ambulatory Visit: Payer: Self-pay

## 2015-07-18 DIAGNOSIS — G4700 Insomnia, unspecified: Secondary | ICD-10-CM

## 2015-07-18 LAB — CBC
HCT: 32.5 % — ABNORMAL LOW (ref 36.0–46.0)
Hemoglobin: 10.8 g/dL — ABNORMAL LOW (ref 12.0–15.0)
MCH: 29.5 pg (ref 26.0–34.0)
MCHC: 33.2 g/dL (ref 30.0–36.0)
MCV: 88.8 fL (ref 78.0–100.0)
PLATELETS: 254 10*3/uL (ref 150–400)
RBC: 3.66 MIL/uL — ABNORMAL LOW (ref 3.87–5.11)
RDW: 14.5 % (ref 11.5–15.5)
WBC: 9 10*3/uL (ref 4.0–10.5)

## 2015-07-18 LAB — TYPE AND SCREEN
ABO/RH(D): O POS
Antibody Screen: NEGATIVE
Unit division: 0
Unit division: 0

## 2015-07-18 MED ORDER — ZOLPIDEM TARTRATE 10 MG PO TABS: 5.0000 mg | ORAL_TABLET | Freq: Every day | ORAL | Status: DC

## 2015-07-18 MED ORDER — POLYETHYLENE GLYCOL 3350 17 G PO PACK: 17.0000 g | PACK | Freq: Two times a day (BID) | ORAL | Status: DC

## 2015-07-18 MED ORDER — HYDROCODONE-ACETAMINOPHEN 7.5-325 MG PO TABS: 1.0000 | ORAL_TABLET | ORAL | Status: DC | PRN

## 2015-07-18 MED ORDER — FERROUS SULFATE 325 (65 FE) MG PO TABS: 325.0000 mg | ORAL_TABLET | Freq: Three times a day (TID) | ORAL | Status: DC

## 2015-07-18 MED ORDER — TIZANIDINE HCL 4 MG PO TABS: 4.0000 mg | ORAL_TABLET | Freq: Four times a day (QID) | ORAL | Status: DC | PRN

## 2015-07-18 MED ORDER — ASPIRIN 325 MG PO TBEC: 325.0000 mg | DELAYED_RELEASE_TABLET | Freq: Two times a day (BID) | ORAL | Status: AC

## 2015-07-18 MED ORDER — DOCUSATE SODIUM 100 MG PO CAPS: 100.0000 mg | ORAL_CAPSULE | Freq: Two times a day (BID) | ORAL | Status: DC

## 2015-07-18 NOTE — Progress Notes (Signed)
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  Follow Up Recommendations  SNF     Equipment Recommendations  None recommended by PT    Recommendations for Other Services OT consult     Precautions / Restrictions Precautions Precautions: Posterior Hip;Fall Precaution Comments: Pt recalls all THP without cues Restrictions Weight Bearing Restrictions: Yes RLE Weight Bearing: Partial weight bearing RLE Partial Weight Bearing Percentage or Pounds: 50%    Mobility  Bed Mobility Overal bed mobility: Needs Assistance Bed Mobility: Supine to Sit     Supine to sit: Min guard     General bed mobility comments: min cues for sequence and adherence to THP  Transfers Overall transfer level: Needs assistance Equipment used: Rolling walker (2 wheeled) Transfers: Sit to/from Stand Sit to Stand: Min guard         General transfer comment: cues for LE management, THP and use of UEs to self assist  Ambulation/Gait Ambulation/Gait assistance: Min guard Ambulation Distance (Feet): 50 Feet Assistive device: Rolling walker (2 wheeled) Gait Pattern/deviations: Step-to pattern Gait velocity: decr   General Gait Details: min cues for sequence, posture, position from RW, and PWB   Stairs Stairs: Yes Stairs assistance: Min assist   Number of Stairs: 1 General stair comments: single step twice to use stool to access high vehicle  Wheelchair Mobility    Modified Rankin (Stroke Patients Only)       Balance                                    Cognition Arousal/Alertness: Awake/alert Behavior During Therapy: WFL for tasks assessed/performed Overall Cognitive Status: Within Functional Limits for tasks  assessed                      Exercises Total Joint Exercises Ankle Circles/Pumps: AROM;Both;15 reps;Supine Quad Sets: AROM;Both;10 reps;Supine Gluteal Sets: AROM;Both;10 reps;Supine Heel Slides: AAROM;Right;20 reps;Supine Hip ABduction/ADduction: AAROM;Right;15 reps;Supine Long Arc Quad: AAROM;AROM;10 reps;Supine    General Comments        Pertinent Vitals/Pain Pain Assessment: 0-10 Pain Score: 4  Pain Location: R hip Pain Descriptors / Indicators: Aching;Sore Pain Intervention(s): Limited activity within patient's tolerance;Monitored during session;Premedicated before session    Home Living                      Prior Function            PT Goals (current goals can now be found in the care plan section) Acute Rehab PT Goals Patient Stated Goal: Regain IND PT Goal Formulation: With patient Time For Goal Achievement: 07/20/15 Potential to Achieve Goals: Good Progress towards PT goals: Progressing toward goals    Frequency  7X/week    PT Plan Current plan remains appropriate    Co-evaluation             End of Session Equipment Utilized During Treatment: Gait belt Activity Tolerance: Patient tolerated treatment well Patient left: in chair;with call bell/phone within reach     Time: 0816-0842 PT Time Calculation (min) (ACUTE ONLY): 26 min  Charges:  $Gait Training: 8-22 mins $Therapeutic Exercise: 8-22 mins  G Codes:      Shyra Emile July 19, 2015, 12:33 PM

## 2015-07-18 NOTE — Telephone Encounter (Signed)
Rx faxed to [facility] @ [phone], phone number [phone]  

## 2015-07-18 NOTE — Care Management Important Message (Signed)
Important Message  Patient Details  Name: Margaret [name] MRN: [id number] Date of Birth: Jul 27, 1940   Medicare Important Message Given:  Yes    Camillo Flaming 07/18/2015, 11:51 AMImportant Message  Patient Details  Name: Margaret [name] MRN: [id number] Date of Birth: [birth year]   Medicare Important Message Given:  Yes    Camillo Flaming 07/18/2015, 11:50 AM

## 2015-07-18 NOTE — Clinical Social Work Placement (Signed)
Patient is set to discharge to [facility] today. Patient & son, [name] aware. Discharge packet given to RN. Patient's son, [name] to transport to SNF.     Raynaldo Opitz, [facility] Clinical Social Worker cell #: [phone]    CLINICAL SOCIAL WORK PLACEMENT  NOTE  Date:  07/18/2015  Patient Details  Name: Margaret [name] MRN: [id number] Date of Birth: [birth year]  Clinical Social Work is seeking post-discharge placement for this patient at the [place] level of care (*CSW will initial, date and re-position this form in  chart as items are completed):  No   Patient/family provided with [facility] Work Department's list of facilities offering this level of care within the geographic area requested by the patient (or if unable, by the patient's family).  Yes   Patient/family informed of their freedom to choose among providers that offer the needed level of care, that participate in Medicare, Medicaid or managed care program needed by the patient, have an available bed and are willing to accept the patient.  Yes   Patient/family informed of [facility]'s ownership interest in [facility] and [facility], as well as of the fact that they are under no obligation to receive care at these facilities.  PASRR submitted to EDS on       PASRR number received on       Existing PASRR number confirmed on 07/16/15     FL2 transmitted to all facilities in geographic area requested by pt/family on 07/16/15     FL2 transmitted to all facilities within larger geographic area on       Patient informed that his/her managed care company has contracts with or will negotiate with certain facilities, including the following:            Patient/family informed of bed offers received.  Patient chooses bed at [facility]     Physician recommends and patient chooses bed at [facility]    Patient to be transferred to [facility] on  07/18/15.  Patient to be transferred to facility by son, [name]     Patient family notified on 07/18/15 of transfer.  Name of family member notified:  patient's son, John     PHYSICIAN       Additional Comment:    _______________________________________________ Standley Brooking, LCSW 07/18/2015, 9:02 AM
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
## 2015-07-18 NOTE — Progress Notes (Signed)
     Subjective: 3 Days Post-Op Procedure(s) (LRB): OPEN REDUCTION RIGHT HIP STATUS POST TOTAL HIP (Right)   Patient reports pain as mild, pain controlled. Feels that she has more energy after receiving 2 units of blood.  No events throughout the night.  Ready to be discharged to skilled nursing facility.  Objective:   VITALS:   Filed Vitals:   07/17/15 2042 07/18/15 0447  BP: 117/49 140/63  Pulse: 71 65  Temp: 97.9 F (36.6 C) 97.5 F (36.4 C)  Resp: 18 16    Dorsiflexion/Plantar flexion intact Incision: dressing C/D/I No cellulitis present Compartment soft  LABS  Recent Labs  07/16/15 0520 07/17/15 0423 07/18/15 0454  HGB 7.9* 7.0* 10.8*  HCT 24.2* 21.2* 32.5*  WBC 10.8* 11.6* 9.0  PLT 244 211 254     Recent Labs  07/16/15 0520 07/17/15 0423  NA 137 135  K 4.4 4.4  BUN 18 20  CREATININE 0.66 0.71  GLUCOSE 141* 145*     Assessment/Plan: 3 Days Post-Op Procedure(s) (LRB): OPEN REDUCTION RIGHT HIP STATUS POST TOTAL HIP (Right) Up with therapy Discharge to SNF  Follow up in 2 weeks at [facility]. Follow up with OLIN,Chriss Redel D in 2 weeks.  Contact information:  [facility] [address] [place] [phone]    ABLA  Treated with 2 units of blood yesterday and continued use of iron Patient feeling more energy after receiving the blood yesterday  Overweight (BMI 25-29.9) Estimated body mass index is 27.62 kg/(m^2) as calculated from the following:   Height as of this encounter: 5\' 4"  (1.626 m).   Weight as of this encounter: 73.029 kg (161 lb). Patient also counseled that weight may inhibit the healing process Patient counseled that losing weight will help with future health issues        [place] Pugh. Ostin Mathey   PAC  07/18/2015, 8:22 AM

## 2015-07-19 ENCOUNTER — Non-Acute Institutional Stay (SKILLED_NURSING_FACILITY): Payer: Medicare Other | Admitting: Internal Medicine

## 2015-07-19 DIAGNOSIS — G4700 Insomnia, unspecified: Secondary | ICD-10-CM

## 2015-07-19 DIAGNOSIS — I10 Essential (primary) hypertension: Secondary | ICD-10-CM | POA: Diagnosis not present

## 2015-07-19 DIAGNOSIS — G4733 Obstructive sleep apnea (adult) (pediatric): Secondary | ICD-10-CM

## 2015-07-19 DIAGNOSIS — E7800 Pure hypercholesterolemia, unspecified: Secondary | ICD-10-CM | POA: Diagnosis not present

## 2015-07-19 DIAGNOSIS — E038 Other specified hypothyroidism: Secondary | ICD-10-CM | POA: Diagnosis not present

## 2015-07-19 DIAGNOSIS — R2681 Unsteadiness on feet: Secondary | ICD-10-CM

## 2015-07-19 DIAGNOSIS — D62 Acute posthemorrhagic anemia: Secondary | ICD-10-CM | POA: Diagnosis not present

## 2015-07-19 DIAGNOSIS — K219 Gastro-esophageal reflux disease without esophagitis: Secondary | ICD-10-CM | POA: Diagnosis not present

## 2015-07-19 DIAGNOSIS — K5900 Constipation, unspecified: Secondary | ICD-10-CM | POA: Diagnosis not present

## 2015-07-19 DIAGNOSIS — M9701XD Periprosthetic fracture around internal prosthetic right hip joint, subsequent encounter: Secondary | ICD-10-CM | POA: Diagnosis not present
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
## 2015-07-26 ENCOUNTER — Encounter: Payer: Self-pay | Admitting: Adult Health

## 2015-07-26 ENCOUNTER — Non-Acute Institutional Stay (SKILLED_NURSING_FACILITY): Payer: Medicare Other | Admitting: Adult Health

## 2015-07-26 DIAGNOSIS — E038 Other specified hypothyroidism: Secondary | ICD-10-CM | POA: Diagnosis not present

## 2015-07-26 DIAGNOSIS — K5900 Constipation, unspecified: Secondary | ICD-10-CM

## 2015-07-26 DIAGNOSIS — G4733 Obstructive sleep apnea (adult) (pediatric): Secondary | ICD-10-CM | POA: Diagnosis not present

## 2015-07-26 DIAGNOSIS — G4700 Insomnia, unspecified: Secondary | ICD-10-CM

## 2015-07-26 DIAGNOSIS — I10 Essential (primary) hypertension: Secondary | ICD-10-CM

## 2015-07-26 DIAGNOSIS — E785 Hyperlipidemia, unspecified: Secondary | ICD-10-CM

## 2015-07-26 DIAGNOSIS — K219 Gastro-esophageal reflux disease without esophagitis: Secondary | ICD-10-CM

## 2015-07-26 DIAGNOSIS — N3281 Overactive bladder: Secondary | ICD-10-CM

## 2015-07-26 DIAGNOSIS — M9701XD Periprosthetic fracture around internal prosthetic right hip joint, subsequent encounter: Secondary | ICD-10-CM

## 2015-07-26 DIAGNOSIS — D62 Acute posthemorrhagic anemia: Secondary | ICD-10-CM
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
## 2015-09-12 ENCOUNTER — Encounter: Payer: Self-pay | Admitting: *Deleted

## 2016-07-28 ENCOUNTER — Other Ambulatory Visit: Payer: Self-pay | Admitting: Family Medicine

## 2017-07-16 ENCOUNTER — Other Ambulatory Visit: Payer: Self-pay | Admitting: Family Medicine

## 2024-03-06 DEATH — deceased
